# Patient Record
Sex: Female | Born: 1960 | Race: Black or African American | Hispanic: No | Marital: Single | Smoking: Never smoker
Health system: Southern US, Community
[De-identification: ages and names within clinical notes are randomized; demographics above are authoritative.]

## PROBLEM LIST (undated history)

## (undated) DIAGNOSIS — E785 Hyperlipidemia, unspecified: Secondary | ICD-10-CM

## (undated) DIAGNOSIS — E119 Type 2 diabetes mellitus without complications: Secondary | ICD-10-CM

## (undated) DIAGNOSIS — I10 Essential (primary) hypertension: Secondary | ICD-10-CM

## (undated) HISTORY — PX: ABDOMINAL HYSTERECTOMY: SHX81

## (undated) HISTORY — DX: Hyperlipidemia, unspecified: E78.5

## (undated) HISTORY — DX: Type 2 diabetes mellitus without complications: E11.9

## (undated) HISTORY — PX: VAGINAL PROLAPSE REPAIR: SHX830

## (undated) HISTORY — DX: Essential (primary) hypertension: I10

---

## 2014-02-10 ENCOUNTER — Other Ambulatory Visit (HOSPITAL_COMMUNITY): Payer: Self-pay | Admitting: Family Medicine

## 2014-02-10 DIAGNOSIS — R229 Localized swelling, mass and lump, unspecified: Principal | ICD-10-CM

## 2014-02-10 DIAGNOSIS — IMO0002 Reserved for concepts with insufficient information to code with codable children: Secondary | ICD-10-CM

## 2014-02-24 ENCOUNTER — Encounter (HOSPITAL_COMMUNITY): Payer: Self-pay

## 2014-03-17 ENCOUNTER — Ambulatory Visit (HOSPITAL_COMMUNITY)
Admission: RE | Admit: 2014-03-17 | Discharge: 2014-03-17 | Disposition: A | Discharge status: Home / Self Care | Payer: Self-pay | Source: Ambulatory Visit | Attending: Family Medicine | Admitting: Family Medicine

## 2014-03-17 ENCOUNTER — Other Ambulatory Visit (HOSPITAL_COMMUNITY): Payer: Self-pay | Admitting: Family Medicine

## 2014-03-17 DIAGNOSIS — R229 Localized swelling, mass and lump, unspecified: Secondary | ICD-10-CM

## 2014-03-17 DIAGNOSIS — IMO0002 Reserved for concepts with insufficient information to code with codable children: Secondary | ICD-10-CM

## 2014-03-17 DIAGNOSIS — R928 Other abnormal and inconclusive findings on diagnostic imaging of breast: Secondary | ICD-10-CM | POA: Insufficient documentation

## 2018-01-14 LAB — LIPID PANEL
Cholesterol: 355 — AB (ref 0–200)
HDL: 55 (ref 35–70)
LDL Cholesterol: 264
Triglycerides: 187 — AB (ref 40–160)

## 2018-01-14 LAB — HEMOGLOBIN A1C: HEMOGLOBIN A1C: 7.4

## 2018-03-24 ENCOUNTER — Ambulatory Visit (INDEPENDENT_AMBULATORY_CARE_PROVIDER_SITE_OTHER): Payer: Self-pay | Admitting: "Endocrinology

## 2018-03-24 ENCOUNTER — Encounter: Payer: Self-pay | Admitting: "Endocrinology

## 2018-03-24 VITALS — BP 148/90 | HR 76 | Ht 61.0 in | Wt 173.0 lb

## 2018-03-24 DIAGNOSIS — N183 Chronic kidney disease, stage 3 (moderate): Secondary | ICD-10-CM

## 2018-03-24 DIAGNOSIS — E1122 Type 2 diabetes mellitus with diabetic chronic kidney disease: Secondary | ICD-10-CM | POA: Insufficient documentation

## 2018-03-24 DIAGNOSIS — I10 Essential (primary) hypertension: Secondary | ICD-10-CM

## 2018-03-24 DIAGNOSIS — Z6832 Body mass index (BMI) 32.0-32.9, adult: Secondary | ICD-10-CM

## 2018-03-24 DIAGNOSIS — E6609 Other obesity due to excess calories: Secondary | ICD-10-CM

## 2018-03-24 DIAGNOSIS — Z794 Long term (current) use of insulin: Secondary | ICD-10-CM | POA: Insufficient documentation

## 2018-03-24 DIAGNOSIS — E782 Mixed hyperlipidemia: Secondary | ICD-10-CM

## 2018-03-24 NOTE — Patient Instructions (Signed)

## 2018-03-24 NOTE — Progress Notes (Signed)
Endocrinology Consult Note       03/24/2018, 1:29 PM   Subjective:    Patient ID: Ana Wallace, female    DOB: 1961-10-13.  Ana Wallace is being seen in consultation for management of currently uncontrolled symptomatic diabetes requested by  [facility]   Past Medical History:  Diagnosis Date  . Diabetes mellitus, type II (HCC)   . Hyperlipidemia   . Hypertension    Past Surgical History:  Procedure Laterality Date  . ABDOMINAL HYSTERECTOMY    . VAGINAL PROLAPSE REPAIR     Social History   Socioeconomic History  . Marital status: Single    Spouse name: Not on file  . Number of children: Not on file  . Years of education: Not on file  . Highest education level: Not on file  Occupational History  . Not on file  Social Needs  . Financial resource strain: Not on file  . Food insecurity:    Worry: Not on file    Inability: Not on file  . Transportation needs:    Medical: Not on file    Non-medical: Not on file  Tobacco Use  . Smoking status: Never Smoker  . Smokeless tobacco: Never Used  Substance and Sexual Activity  . Alcohol use: Never    Frequency: Never  . Drug use: Never  . Sexual activity: Never  Lifestyle  . Physical activity:    Days per week: Not on file    Minutes per session: Not on file  . Stress: Not on file  Relationships  . Social connections:    Talks on phone: Not on file    Gets together: Not on file    Attends religious service: Not on file    Active member of club or organization: Not on file    Attends meetings of clubs or organizations: Not on file    Relationship status: Not on file  Other Topics Concern  . Not on file  Social History Narrative  . Not on file   Outpatient Encounter Medications as of 03/24/2018  Medication Sig  . Azilsartan Medoxomil 40 MG TABS Take by mouth daily.  . busPIRone (BUSPAR) 5 MG tablet Take  5 mg by mouth 2 (two) times daily.  . DULoxetine (CYMBALTA) 60 MG capsule Take 60 mg by mouth daily.  . Insulin Glargine (LANTUS) 100 UNIT/ML Solostar Pen Inject 30 Units into the skin at bedtime.  . metFORMIN (GLUCOPHAGE) 1000 MG tablet Take 500 mg by mouth 2 (two) times daily with a meal.  . pregabalin (LYRICA) 150 MG capsule Take 150 mg by mouth 2 (two) times daily.  . Vitamin D, Ergocalciferol, (DRISDOL) 50000 units CAPS capsule Take 50,000 Units by mouth every 7 (seven) days.   No facility-administered encounter medications on file as of 03/24/2018.     ALLERGIES: Allergies  Allergen Reactions  . Nickel Other (See Comments)    Rash with burning sensation    VACCINATION STATUS:  There is no immunization history on file for this patient.  Diabetes  She presents for her initial diabetic visit. She has type 2 diabetes mellitus.  Onset time: She was diagnosed at approximate age of 57 years. There are no hypoglycemic associated symptoms. Pertinent negatives for hypoglycemia include no confusion, headaches, pallor or seizures. There are no diabetic associated symptoms. Pertinent negatives for diabetes include no chest pain, no polydipsia, no polyphagia and no polyuria. There are no hypoglycemic complications. Symptoms are stable. Diabetic complications include nephropathy. Current diabetic treatment includes insulin injections and oral agent (monotherapy). Her weight is increasing steadily. She is following a generally unhealthy diet. When asked about meal planning, she reported none. She has had a previous visit with a dietitian. She never participates in exercise. (He did not bring any meter no logs to review today.  Her A1c was found to be 7.4% on January 14, 2018.) An ACE inhibitor/angiotensin II receptor blocker is being taken.  Hyperlipidemia  This is a chronic problem. The current episode started more than 1 year ago. The problem is uncontrolled. Pertinent negatives include no chest pain,  myalgias or shortness of breath. She is currently on no antihyperlipidemic treatment (She reports side effects from statin medications.). The current treatment provides no improvement of lipids. Risk factors for coronary artery disease include dyslipidemia, diabetes mellitus, a sedentary lifestyle, post-menopausal and obesity.      Review of Systems  Constitutional: Negative for chills, fever and unexpected weight change.  HENT: Negative for trouble swallowing and voice change.   Eyes: Negative for visual disturbance.  Respiratory: Negative for cough, shortness of breath and wheezing.   Cardiovascular: Negative for chest pain, palpitations and leg swelling.  Gastrointestinal: Negative for diarrhea, nausea and vomiting.  Endocrine: Negative for cold intolerance, heat intolerance, polydipsia, polyphagia and polyuria.  Musculoskeletal: Negative for arthralgias and myalgias.  Skin: Negative for color change, pallor, rash and wound.  Neurological: Negative for seizures and headaches.  Psychiatric/Behavioral: Negative for confusion and suicidal ideas.    Objective:    BP (!) 148/90   Pulse 76   Ht 5\' 1"  (1.549 m)   Wt 173 lb (78.5 kg)   BMI 32.69 kg/m   Wt Readings from Last 3 Encounters:  03/24/18 173 lb (78.5 kg)     Physical Exam  Constitutional: She is oriented to person, place, and time. She appears well-developed.  HENT:  Head: Normocephalic and atraumatic.  Eyes: EOM are normal.  Neck: Normal range of motion. Neck supple. No tracheal deviation present. No thyromegaly present.  Cardiovascular: Normal rate and regular rhythm.  Pulmonary/Chest: Effort normal and breath sounds normal.  Abdominal: Soft. Bowel sounds are normal. There is no tenderness. There is no guarding.  Musculoskeletal: Normal range of motion. She exhibits no edema.  Neurological: She is alert and oriented to person, place, and time. She has normal reflexes. No cranial nerve deficit. Coordination normal.   Skin: Skin is warm and dry. No rash noted. No erythema. No pallor.  Psychiatric: She has a normal mood and affect. Judgment normal.   Recent Results (from the past 2160 hour(s))  Lipid panel     Status: Abnormal   Collection Time: 01/14/18 12:00 AM  Result Value Ref Range   Triglycerides 187 (A) 40 - 160   Cholesterol 355 (A) 0 - 200   HDL 55 35 - 70   LDL Cholesterol 264   Hemoglobin A1c     Status: None   Collection Time: 01/14/18 12:00 AM  Result Value Ref Range   Hemoglobin A1C 7.4     Assessment & Plan:   1. Type 2 diabetes mellitus with stage 3 chronic kidney  disease, with long-term current use of insulin (HCC)  - Ana Wallace has currently uncontrolled symptomatic type 2 DM since 57 years of age,  with most recent A1c of 7.4 %. Recent labs reviewed.  -her diabetes is complicated by stage 3 renal insufficiency and Ana Wallace remains at a high risk for more acute and chronic complications which include CAD, CVA, CKD, retinopathy, and neuropathy. These are all discussed in detail with the patient.  - I have counseled her on diet management and weight loss, by adopting a carbohydrate restricted/protein rich diet.  - Suggestion is made for her to avoid simple carbohydrates  from her diet including Cakes, Sweet Desserts, Ice Cream, Soda (diet and regular), Sweet Tea, Candies, Chips, Cookies, Store Bought Juices, Alcohol in Excess of  1-2 drinks a day, Artificial Sweeteners, and "Sugar-free" Products. This will help patient to have stable blood glucose profile and potentially avoid unintended weight gain.  - I encouraged her to switch to  unprocessed or minimally processed complex starch and increased protein intake (animal or plant source), fruits, and vegetables.  - she is advised to stick to a routine mealtimes to eat 3 meals  a day and avoid unnecessary snacks ( to snack only to correct hypoglycemia).   - she will be scheduled with Norm SaltPenny Crumpton, RDN, CDE for  individualized diabetes education.  - I have approached her with the following individualized plan to manage diabetes and patient agrees:   - I approached her to initiate strict monitoring of blood glucose 4 times a day-daily before breakfast, lunch, supper and at bedtime and return in 1 week with her meter and logs for reevaluation.    -In the meantime, I have advised her to continue Lantus 30 units nightly. -I advised her to lower her metformin to 500 mg p.o. twice daily given her renal insufficiency.    -Patient is not a candidate for SGLT2 inhibitors due to CKD.  - she will be considered for incretin therapy as appropriate next visit. - Patient specific target  A1c;  LDL, HDL, Triglycerides, and  Waist Circumference were discussed in detail.  2) BP/HTN: Her blood pressure is uncontrolled.  She is on azilsartan.  She is advised to be consistent with her blood pressure medications and salt restrictions.  3) Lipids/HPL: She has severe dyslipidemia with LDL 200+.  She reports statin intolerance, advised to avoid better and fried food.  She is a candidate for Repatha, however she does not have insurance currently.  4)  Weight/Diet: CDE Consult will be initiated , exercise, and detailed carbohydrates information provided.  5) Chronic Care/Health Maintenance:  -she  is on ARB and Statin medications and  is encouraged to continue to follow up with Ophthalmology, Dentist,  Podiatrist at least yearly or according to recommendations, and advised to  stay away from smoking. I have recommended yearly flu vaccine and pneumonia vaccination at least every 5 years; moderate intensity exercise for up to 150 minutes weekly; and  sleep for at least 7 hours a day.  - I advised patient to [facility] for primary care needs.  - Time spent with the patient: 45 minutes, of which >50% was spent in obtaining information about her symptoms, reviewing her  previous labs, evaluations, and treatments, counseling her about her currently uncontrolled type 2 diabetes, hyperlipidemia, hypertension, and developing a plan to confirm the diagnosis and long term treatment as necessary.  Ana Wallace participated in the discussions, expressed understanding,  and voiced agreement with the above plans.  All questions were answered to her satisfaction. she is encouraged to contact clinic should she have any questions or concerns prior to her return visit.  Follow up plan: - Return in about 1 week (around 03/31/2018) for follow up with meter and logs- no labs.  Marquis Lunch, MD [facility] [address] [place], [place] [address] Phone: [phone]  Fax: [phone]    03/24/2018, 1:29 PM  This note was partially dictated with voice recognition software. Similar sounding words can be transcribed inadequately or may not  be corrected upon review.

## 2018-04-04 ENCOUNTER — Ambulatory Visit: Payer: Self-pay | Admitting: "Endocrinology

## 2018-05-02 ENCOUNTER — Encounter: Payer: Self-pay | Admitting: "Endocrinology

## 2018-05-02 ENCOUNTER — Ambulatory Visit (INDEPENDENT_AMBULATORY_CARE_PROVIDER_SITE_OTHER): Payer: Medicaid Other | Admitting: "Endocrinology

## 2018-05-02 VITALS — BP 137/85 | Ht 61.0 in | Wt 169.4 lb

## 2018-05-02 DIAGNOSIS — Z794 Long term (current) use of insulin: Secondary | ICD-10-CM

## 2018-05-02 DIAGNOSIS — I10 Essential (primary) hypertension: Secondary | ICD-10-CM

## 2018-05-02 DIAGNOSIS — E1122 Type 2 diabetes mellitus with diabetic chronic kidney disease: Secondary | ICD-10-CM

## 2018-05-02 DIAGNOSIS — E782 Mixed hyperlipidemia: Secondary | ICD-10-CM

## 2018-05-02 DIAGNOSIS — N183 Chronic kidney disease, stage 3 (moderate): Secondary | ICD-10-CM

## 2018-05-02 NOTE — Progress Notes (Signed)
Endocrinology Consult Note       05/02/2018, 3:13 PM   Subjective:    Patient ID: Ana Wallace, female    DOB: 1961-04-22.  Ana Wallace is being seen in consultation for management of currently uncontrolled symptomatic diabetes requested by  [facility]   Past Medical History:  Diagnosis Date  . Diabetes mellitus, type II (HCC)   . Hyperlipidemia   . Hypertension    Past Surgical History:  Procedure Laterality Date  . ABDOMINAL HYSTERECTOMY    . VAGINAL PROLAPSE REPAIR     Social History   Socioeconomic History  . Marital status: Single    Spouse name: Not on file  . Number of children: Not on file  . Years of education: Not on file  . Highest education level: Not on file  Occupational History  . Not on file  Social Needs  . Financial resource strain: Not on file  . Food insecurity:    Worry: Not on file    Inability: Not on file  . Transportation needs:    Medical: Not on file    Non-medical: Not on file  Tobacco Use  . Smoking status: Never Smoker  . Smokeless tobacco: Never Used  Substance and Sexual Activity  . Alcohol use: Never    Frequency: Never  . Drug use: Never  . Sexual activity: Never  Lifestyle  . Physical activity:    Days per week: Not on file    Minutes per session: Not on file  . Stress: Not on file  Relationships  . Social connections:    Talks on phone: Not on file    Gets together: Not on file    Attends religious service: Not on file    Active member of club or organization: Not on file    Attends meetings of clubs or organizations: Not on file    Relationship status: Not on file  Other Topics Concern  . Not on file  Social History Narrative  . Not on file   Outpatient Encounter Medications as of 05/02/2018  Medication Sig  . Azilsartan Medoxomil 40 MG TABS Take by mouth daily.  . busPIRone (BUSPAR) 5 MG tablet Take 5  mg by mouth 2 (two) times daily.  . DULoxetine (CYMBALTA) 60 MG capsule Take 60 mg by mouth daily.  . Insulin Glargine (LANTUS) 100 UNIT/ML Solostar Pen Inject 30 Units into the skin at bedtime.  . metFORMIN (GLUCOPHAGE) 1000 MG tablet Take 500 mg by mouth 2 (two) times daily with a meal.  . pregabalin (LYRICA) 150 MG capsule Take 150 mg by mouth 2 (two) times daily.  . Vitamin D, Ergocalciferol, (DRISDOL) 50000 units CAPS capsule Take 50,000 Units by mouth every 7 (seven) days.   No facility-administered encounter medications on file as of 05/02/2018.     ALLERGIES: Allergies  Allergen Reactions  . Nickel Other (See Comments)    Rash with burning sensation    VACCINATION STATUS:  There is no immunization history on file for this patient.  Diabetes  She presents for her follow-up diabetic visit. She has type 2 diabetes mellitus.  Onset time: She was diagnosed at approximate age of 57 years. Her disease course has been improving. There are no hypoglycemic associated symptoms. Pertinent negatives for hypoglycemia include no confusion, headaches, pallor or seizures. There are no diabetic associated symptoms. Pertinent negatives for diabetes include no chest pain, no polydipsia, no polyphagia and no polyuria. There are no hypoglycemic complications. Symptoms are stable. Diabetic complications include nephropathy. Current diabetic treatment includes insulin injections and oral agent (monotherapy). Her weight is decreasing steadily. She is following a generally unhealthy diet. When asked about meal planning, she reported none. She has had a previous visit with a dietitian. She never participates in exercise. Her breakfast blood glucose range is generally 140-180 mg/dl. Her lunch blood glucose range is generally 140-180 mg/dl. Her dinner blood glucose range is generally 140-180 mg/dl. Her bedtime blood glucose range is generally 140-180 mg/dl. Her overall blood glucose range is 140-180 mg/dl. (She did  not  bring any meter no logs to review today.  She reports that her recent a1c was 7.2%.) An ACE inhibitor/angiotensin II receptor blocker is being taken.  Hyperlipidemia  This is a chronic problem. The current episode started more than 1 year ago. The problem is uncontrolled. Exacerbating diseases include chronic renal disease and diabetes. Pertinent negatives include no chest pain, myalgias or shortness of breath. She is currently on no antihyperlipidemic treatment (She reports side effects from statin medications.). The current treatment provides no improvement of lipids. Risk factors for coronary artery disease include dyslipidemia, diabetes mellitus, a sedentary lifestyle, post-menopausal and obesity.  Hypertension  This is a chronic problem. The current episode started more than 1 year ago. The problem is controlled. Pertinent negatives include no chest pain, headaches, palpitations or shortness of breath. Risk factors for coronary artery disease include dyslipidemia and diabetes mellitus. Past treatments include angiotensin blockers. Identifiable causes of hypertension include chronic renal disease.    Review of Systems  Constitutional: Negative for chills, fever and unexpected weight change.  HENT: Negative for trouble swallowing and voice change.   Eyes: Negative for visual disturbance.  Respiratory: Negative for cough, shortness of breath and wheezing.   Cardiovascular: Negative for chest pain, palpitations and leg swelling.  Gastrointestinal: Negative for diarrhea, nausea and vomiting.  Endocrine: Negative for cold intolerance, heat intolerance, polydipsia, polyphagia and polyuria.  Musculoskeletal: Negative for arthralgias and myalgias.  Skin: Negative for color change, pallor, rash and wound.  Neurological: Negative for seizures and headaches.  Psychiatric/Behavioral: Negative for confusion and suicidal ideas.    Objective:    BP 137/85   Ht  (1.549 m)   Wt 169 lb 6.4 oz  (76.8 kg)   BMI 32.01 kg/m   Wt Readings from Last 3 Encounters:  05/02/18 169 lb 6.4 oz (76.8 kg)  03/24/18 173 lb (78.5 kg)     Physical Exam  Constitutional: She is oriented to person, place, and time. She appears well-developed.  HENT:  Head: Normocephalic and atraumatic.  Eyes: EOM are normal.  Neck: Normal range of motion. Neck supple. No tracheal deviation present. No thyromegaly present.  Cardiovascular: Normal rate.  Pulmonary/Chest: Effort normal.  Abdominal: There is no tenderness. There is no guarding.  Musculoskeletal: Normal range of motion. She exhibits no edema.  Neurological: She is alert and oriented to person, place, and time. She has normal reflexes. No cranial nerve deficit. Coordination normal.  Skin: Skin is warm and dry. No rash noted. No erythema. No pallor.  Psychiatric: She has a normal mood and affect. Judgment normal.  Assessment & Plan:   1. Type 2 diabetes mellitus with stage 3 chronic kidney disease, with long-term current use of insulin (HCC)  - Ana Wallace has currently uncontrolled symptomatic type 2 DM since 57 years of age. She reports her pre-visit labs show a1c of 7.2%. Her labs are not available to review. -her diabetes is complicated by stage 3 renal insufficiency and Ana Wallace remains at a high risk for more acute and chronic complications which include CAD, CVA, CKD, retinopathy, and neuropathy. These are all discussed in detail with the patient.  - I have counseled her on diet management and weight loss, by adopting a carbohydrate restricted/protein rich diet.  -  Suggestion is made for her to avoid simple carbohydrates  from her diet including Cakes, Sweet Desserts / Pastries, Ice Cream, Soda (diet and regular), Sweet Tea, Candies, Chips, Cookies, Store Bought Juices, Alcohol in Excess of  1-2 drinks a day, Artificial Sweeteners, and "Sugar-free" Products. This will help patient to have stable blood glucose profile and  potentially avoid unintended weight gain.   - I encouraged her to switch to  unprocessed or minimally processed complex starch and increased protein intake (animal or plant source), fruits, and vegetables.  - she is advised to stick to a routine mealtimes to eat 3 meals  a day and avoid unnecessary snacks ( to snack only to correct hypoglycemia).    - I have approached her with the following individualized plan to manage diabetes and patient agrees:   - Based on her reported improvement in her a1c and her presentation with controlled BG profile, she will not require bolus insulin for now.  - I advised her to continue Lantus 30 units qhs, associated with strict monitoring of blood glucose 2 times a day-daily before breakfast,  and at bedtime.   -I advised her to continue  metformin  500 mg p.o. twice daily given her renal insufficiency.    -Patient is not a candidate for SGLT2 inhibitors due to CKD.  - she will be considered for incretin therapy as appropriate next visit. - Patient specific target  A1c;  LDL, HDL, Triglycerides, and  Waist Circumference were discussed in detail.  2) BP/HTN: Her blood pressure is controlled.  She is on azilsartan.  She is advised to be consistent with her blood pressure medications and salt restrictions.  3) Lipids/HPL: She has severe dyslipidemia with LDL 200+.  She reports statin intolerance, advised to avoid better and fried food.  She is a candidate for Repatha, however she does not have insurance currently.  4)  Weight/Diet: CDE Consult has been  initiated , exercise, and detailed carbohydrates information provided.  5) Chronic Care/Health Maintenance:  -she  is on ARB and Statin medications and  is encouraged to continue to follow up with Ophthalmology, Dentist,  Podiatrist at least yearly or according to recommendations, and advised to  stay away from smoking. I have recommended yearly flu vaccine and pneumonia vaccination at least every 5 years;  moderate intensity exercise for up to 150 minutes weekly; and  sleep for at least 7 hours a day.  - I advised patient to maintain close follow up with [facility] for primary care needs.  - Time spent with the patient: 25 min, of which >50% was spent in reviewing her blood glucose logs , discussing her hypo- and hyper-glycemic episodes, reviewing her current and  previous labs and insulin doses and developing a plan to avoid hypo- and hyper-glycemia. Please  refer to Patient Instructions for Blood Glucose Monitoring and Insulin/Medications Dosing Guide"  in media tab for additional information. Ana Wallace participated in the discussions, expressed understanding, and voiced agreement with the above plans.  All questions were answered to her satisfaction. she is encouraged to contact clinic should she have any questions or concerns prior to her return visit.  Follow up plan: - Return in about 3 months (around 08/02/2018) for follow up with pre-visit labs, meter, and logs.  Marquis Lunch, MD [facility] [address] [place], [place] [address] Phone: [phone]  Fax: [phone]    05/02/2018, 3:13 PM  This note was partially dictated with voice recognition software. Similar sounding words can be transcribed inadequately or may not  be corrected upon review.

## 2018-05-02 NOTE — Patient Instructions (Signed)

## 2018-05-29 ENCOUNTER — Other Ambulatory Visit (HOSPITAL_COMMUNITY): Payer: Self-pay | Admitting: Nephrology

## 2018-05-29 DIAGNOSIS — N183 Chronic kidney disease, stage 3 unspecified: Secondary | ICD-10-CM

## 2018-06-17 ENCOUNTER — Telehealth: Payer: Self-pay | Admitting: Nutrition

## 2018-06-17 ENCOUNTER — Other Ambulatory Visit: Payer: Self-pay | Admitting: "Endocrinology

## 2018-06-17 DIAGNOSIS — N183 Chronic kidney disease, stage 3 unspecified: Secondary | ICD-10-CM

## 2018-06-17 DIAGNOSIS — Z794 Long term (current) use of insulin: Principal | ICD-10-CM

## 2018-06-17 DIAGNOSIS — E1122 Type 2 diabetes mellitus with diabetic chronic kidney disease: Secondary | ICD-10-CM

## 2018-06-17 NOTE — Telephone Encounter (Signed)
[facility] to [facility] to [facility] labs from [facility] regarding pt's elevated Uric Acid level of 9.2. Nurse or provider to return call.

## 2018-06-18 ENCOUNTER — Ambulatory Visit: Payer: Medicaid Other | Admitting: Nutrition

## 2018-06-19 ENCOUNTER — Ambulatory Visit (HOSPITAL_COMMUNITY)
Admission: RE | Admit: 2018-06-19 | Discharge: 2018-06-19 | Disposition: A | Discharge status: Home / Self Care | Payer: Self-pay | Source: Ambulatory Visit | Attending: Nephrology | Admitting: Nephrology

## 2018-06-19 DIAGNOSIS — N183 Chronic kidney disease, stage 3 unspecified: Secondary | ICD-10-CM

## 2018-06-26 ENCOUNTER — Telehealth: Payer: Self-pay | Admitting: "Endocrinology

## 2018-06-26 NOTE — Telephone Encounter (Signed)
Patient is aware of the recommendations 

## 2018-06-26 NOTE — Telephone Encounter (Signed)
Ana Wallace is calling stating that she cant remember if Dr. Fransico HimNida told her to quit taking metFORMIN (GLUCOPHAGE) 1000 MG tablet or to continue it , please advise?

## 2018-06-26 NOTE — Telephone Encounter (Signed)
She is advised to stop it  Until repeat labs, just continue on insulin and keep follow up appointment.

## 2018-07-29 ENCOUNTER — Encounter: Payer: Self-pay | Admitting: "Endocrinology

## 2018-08-08 ENCOUNTER — Ambulatory Visit: Payer: Medicaid Other | Admitting: "Endocrinology

## 2018-08-19 LAB — HEMOGLOBIN A1C
EAG (MMOL/L): 13.6 (calc)
HEMOGLOBIN A1C: 10.2 %{Hb} — AB (ref <5.7)
Mean Plasma Glucose: 246 (calc)

## 2018-08-19 LAB — COMPLETE METABOLIC PANEL WITH GFR
AG RATIO: 1.5 (calc) (ref 1.0–2.5)
ALKALINE PHOSPHATASE (APISO): 53 U/L (ref 33–130)
ALT: 11 U/L (ref 6–29)
AST: 12 U/L (ref 10–35)
Albumin: 4.1 g/dL (ref 3.6–5.1)
BUN/Creatinine Ratio: 19 (calc) (ref 6–22)
BUN: 27 mg/dL — ABNORMAL HIGH (ref 7–25)
CO2: 29 mmol/L (ref 20–32)
Calcium: 9.6 mg/dL (ref 8.6–10.4)
Chloride: 105 mmol/L (ref 98–110)
Creat: 1.44 mg/dL — ABNORMAL HIGH (ref 0.50–1.05)
GFR, EST NON AFRICAN AMERICAN: 41 mL/min/{1.73_m2} — AB (ref >=60)
GFR, Est African American: 47 mL/min/{1.73_m2} — ABNORMAL LOW (ref >=60)
Globulin: 2.7 g/dL (calc) (ref 1.9–3.7)
Glucose, Bld: 203 mg/dL — ABNORMAL HIGH (ref 65–99)
POTASSIUM: 4.7 mmol/L (ref 3.5–5.3)
SODIUM: 141 mmol/L (ref 135–146)
Total Bilirubin: 0.6 mg/dL (ref 0.2–1.2)
Total Protein: 6.8 g/dL (ref 6.1–8.1)

## 2018-08-28 ENCOUNTER — Encounter: Payer: Medicaid Other | Attending: Emergency Medicine | Admitting: Nutrition

## 2018-08-28 ENCOUNTER — Encounter: Payer: Self-pay | Admitting: "Endocrinology

## 2018-08-28 ENCOUNTER — Encounter: Payer: Self-pay | Admitting: Nutrition

## 2018-08-28 ENCOUNTER — Ambulatory Visit (INDEPENDENT_AMBULATORY_CARE_PROVIDER_SITE_OTHER): Payer: Self-pay | Admitting: "Endocrinology

## 2018-08-28 VITALS — BP 133/76 | HR 85 | Ht 61.0 in | Wt 169.0 lb

## 2018-08-28 DIAGNOSIS — Z794 Long term (current) use of insulin: Secondary | ICD-10-CM

## 2018-08-28 DIAGNOSIS — N183 Chronic kidney disease, stage 3 unspecified: Secondary | ICD-10-CM

## 2018-08-28 DIAGNOSIS — E1122 Type 2 diabetes mellitus with diabetic chronic kidney disease: Secondary | ICD-10-CM

## 2018-08-28 DIAGNOSIS — E6609 Other obesity due to excess calories: Secondary | ICD-10-CM

## 2018-08-28 DIAGNOSIS — IMO0002 Reserved for concepts with insufficient information to code with codable children: Secondary | ICD-10-CM

## 2018-08-28 DIAGNOSIS — E782 Mixed hyperlipidemia: Secondary | ICD-10-CM

## 2018-08-28 DIAGNOSIS — E1165 Type 2 diabetes mellitus with hyperglycemia: Secondary | ICD-10-CM

## 2018-08-28 DIAGNOSIS — Z6832 Body mass index (BMI) 32.0-32.9, adult: Secondary | ICD-10-CM

## 2018-08-28 DIAGNOSIS — I10 Essential (primary) hypertension: Secondary | ICD-10-CM

## 2018-08-28 DIAGNOSIS — E118 Type 2 diabetes mellitus with unspecified complications: Principal | ICD-10-CM

## 2018-08-28 MED ORDER — INSULIN GLARGINE 100 UNIT/ML SOLOSTAR PEN: 50.0000 [IU] | PEN_INJECTOR | Freq: Every day | SUBCUTANEOUS | 2 refills | Status: DC

## 2018-08-28 MED ORDER — INSULIN PEN NEEDLE 31G X 8 MM MISC: 1.0000 | 3 refills | Status: DC

## 2018-08-28 MED ORDER — NIACIN ER (ANTIHYPERLIPIDEMIC) 500 MG PO TBCR: 500.0000 mg | EXTENDED_RELEASE_TABLET | Freq: Every day | ORAL | 3 refills | Status: DC

## 2018-08-28 NOTE — Progress Notes (Signed)
  Medical Nutrition Therapy:  Appt start time: 0930 end time:  0945   Assessment:  Primary concerns today: Diabetes Type 2, overweight, Stg 3 CKD Walkin from Dr.Nida, Endocrinology. Barriers are fiancial and transportation. Works part time at [employer]. Eats 2-3 meals per day. Trying to avoid foods high in potassium and sodium Willing to work on changes with diet to improve her BS and kidney issues.  Lantus 50 units a day.  Lab Results  Component Value Date   HGBA1C 10.2 (H) 08/18/2018   CMP Latest Ref Rng & Units 08/18/2018  Glucose 65 - 99 mg/dL 161(W203(H)  BUN 7 - 25 mg/dL 96(E27(H)  Creatinine 4.540.50 - 1.05 mg/dL 0.98(J1.44(H)  Sodium 191135 - 478146 mmol/L 141  Potassium 3.5 - 5.3 mmol/L 4.7  Chloride 98 - 110 mmol/L 105  CO2 20 - 32 mmol/L 29  Calcium 8.6 - 10.4 mg/dL 9.6  Total Protein 6.1 - 8.1 g/dL 6.8  Total Bilirubin 0.2 - 1.2 mg/dL 0.6  AST 10 - 35 U/L 12  ALT 6 - 29 U/L 11   Lipid Panel     Component Value Date/Time   CHOL 355 (A) 01/14/2018   TRIG 187 (A) 01/14/2018   HDL 55 01/14/2018   LDLCALC 264 01/14/2018     Preferred Learning Style:   Auditory  Visual  Hands on  Learning Readiness:   Ready  Change in progress   MEDICATIONS: see list   DIETARY INTAKE:  Eats 2 meals per day. Usually skips lunch and eats dinner later.  Usual physical activity: ADL  Estimated energy needs: 1200 calories 135 g carbohydrates 90 g protein 33 g fat  Progress Towards Goal(s):  In progress.   Nutritional Diagnosis:  NB-1.1 Food and nutrition-related knowledge deficit As related to Diabets.  As evidenced by A1C 10.3%.    Intervention: Nutrition and Diabetes education provided on My Plate, CHO counting, meal planning, portion sizes, timing of meals, avoiding snacks between meals unless having a low blood sugar, target ranges for A1C and blood sugars, signs/symptoms and treatment of hyper/hypoglycemia, monitoring blood sugars, taking medications as prescribed, benefits of exercising  30 minutes per day and prevention of complications of DM. Marland Kitchen. Goals Follow MY Plate Follow Renal Diet Take insulin as prescribed Drink only water  Cut out snacks Eat three meals per day. Get A1C down to 7%   Teaching Method Utilized:  Visual Auditory Hands on  Handouts given during visit include:  The Plate Method   Renal Diet  Barriers to learning/adherence to lifestyle change: none  Demonstrated degree of understanding via:  Teach Back   Monitoring/Evaluation:  Dietary intake, exercise, nd body weight in 2 week(s).

## 2018-08-28 NOTE — Patient Instructions (Signed)

## 2018-08-28 NOTE — Patient Instructions (Signed)
Goals Follow MY Plate Follow Renal Diet Take insulin as prescribed Drink only water  Cut out snacks Eat three meals per day. Get A1C down to 7%

## 2018-08-28 NOTE — Progress Notes (Signed)
Endocrinology follow-up note       08/28/2018, 2:07 PM   Subjective:    Patient ID: Ana Wallace, female    DOB: 12/11/1961.  Ana Wallace is being seen in follow up  for management of currently uncontrolled symptomatic diabetes, hyperlipidemia, hypertension [facility]  [facility]   Past Medical History:  Diagnosis Date  . Diabetes mellitus, type II (HCC)   . Hyperlipidemia   . Hypertension    Past Surgical History:  Procedure Laterality Date  . ABDOMINAL HYSTERECTOMY    . VAGINAL PROLAPSE REPAIR     Social History   Socioeconomic History  . Marital status: Single    Spouse name: Not on file  . Number of children: Not on file  . Years of education: Not on file  . Highest education level: Not on file  Occupational History  . Not on file  Social Needs  . Financial resource strain: Not on file  . Food insecurity:    Worry: Not on file    Inability: Not on file  . Transportation needs:    Medical: Not on file    Non-medical: Not on file  Tobacco Use  . Smoking status: Never Smoker  . Smokeless tobacco: Never Used  Substance and Sexual Activity  . Alcohol use: Never    Frequency: Never  . Drug use: Never  . Sexual activity: Never  Lifestyle  . Physical activity:    Days per week: Not on file    Minutes per session: Not on file  . Stress: Not on file  Relationships  . Social connections:    Talks on phone: Not on file    Gets together: Not on file    Attends religious service: Not on file    Active member of club or organization: Not on file    Attends meetings of clubs or organizations: Not on file    Relationship status: Not on file  Other Topics Concern  . Not on file  Social History Narrative  . Not on file   Outpatient Encounter Medications as of 08/28/2018  Medication Sig  . losartan (COZAAR) 25 MG tablet Take 25 mg by mouth daily.  .  Azilsartan Medoxomil 40 MG TABS Take by mouth daily.  . busPIRone (BUSPAR) 5 MG tablet Take 5 mg by mouth 2 (two) times daily.  . DULoxetine (CYMBALTA) 60 MG capsule Take 60 mg by mouth daily.  . Insulin Glargine (LANTUS SOLOSTAR) 100 UNIT/ML Solostar Pen Inject 50 Units into the skin at bedtime.  . Insulin Pen Needle (B-D ULTRAFINE III SHORT PEN) 31G X 8 MM MISC 1 each by Does not apply route as directed.  . pregabalin (LYRICA) 150 MG capsule Take 150 mg by mouth 2 (two) times daily.  . Vitamin D, Ergocalciferol, (DRISDOL) 50000 units CAPS capsule Take 50,000 Units by mouth every 7 (seven) days.  . [DISCONTINUED] Insulin Glargine (LANTUS) 100 UNIT/ML Solostar Pen Inject 50 Units into the skin at bedtime.  . [DISCONTINUED] metFORMIN (GLUCOPHAGE) 1000 MG tablet Take 500 mg by mouth 2 (two) times daily with a meal.   No facility-administered  encounter medications on file as of 08/28/2018.     ALLERGIES: Allergies  Allergen Reactions  . Nickel Other (See Comments)    Rash with burning sensation    VACCINATION STATUS:  There is no immunization history on file for this patient.  Diabetes  She presents for her follow-up diabetic visit. She has type 2 diabetes mellitus. Onset time: She was diagnosed at approximate age of 47 years. Her disease course has been worsening. There are no hypoglycemic associated symptoms. Pertinent negatives for hypoglycemia include no confusion, headaches, pallor or seizures. There are no diabetic associated symptoms. Pertinent negatives for diabetes include no chest pain, no polydipsia, no polyphagia and no polyuria. There are no hypoglycemic complications. Symptoms are worsening. Diabetic complications include nephropathy. Current diabetic treatment includes insulin injections and oral agent (monotherapy). Her weight is fluctuating minimally. She is following a generally unhealthy diet. When asked about meal planning, she reported none. She has had a previous visit with  a dietitian. She never participates in exercise. Her breakfast blood glucose range is generally >200 mg/dl. Her overall blood glucose range is >200 mg/dl. An ACE inhibitor/angiotensin II receptor blocker is being taken.  Hyperlipidemia  This is a chronic problem. The current episode started more than 1 year ago. The problem is uncontrolled. Exacerbating diseases include chronic renal disease and diabetes. Pertinent negatives include no chest pain, myalgias or shortness of breath. She is currently on no antihyperlipidemic treatment (She reports side effects from statin medications.). The current treatment provides no improvement of lipids. Risk factors for coronary artery disease include dyslipidemia, diabetes mellitus, a sedentary lifestyle, post-menopausal and obesity.  Hypertension  This is a chronic problem. The current episode started more than 1 year ago. The problem is controlled. Pertinent negatives include no chest pain, headaches, palpitations or shortness of breath. Risk factors for coronary artery disease include dyslipidemia and diabetes mellitus. Past treatments include angiotensin blockers. Identifiable causes of hypertension include chronic renal disease.    Review of Systems  Constitutional: Negative for chills, fever and unexpected weight change.  HENT: Negative for trouble swallowing and voice change.   Eyes: Negative for visual disturbance.  Respiratory: Negative for cough, shortness of breath and wheezing.   Cardiovascular: Negative for chest pain, palpitations and leg swelling.  Gastrointestinal: Negative for diarrhea, nausea and vomiting.  Endocrine: Negative for cold intolerance, heat intolerance, polydipsia, polyphagia and polyuria.  Musculoskeletal: Negative for arthralgias and myalgias.  Skin: Negative for color change, pallor, rash and wound.  Neurological: Negative for seizures and headaches.  Psychiatric/Behavioral: Negative for confusion and suicidal ideas.     Objective:    BP 133/76   Pulse 85   Ht 5\' 1"  (1.549 m)   Wt 169 lb (76.7 kg)   BMI 31.93 kg/m   Wt Readings from Last 3 Encounters:  08/28/18 169 lb (76.7 kg)  05/02/18 169 lb 6.4 oz (76.8 kg)  03/24/18 173 lb (78.5 kg)     Physical Exam  Constitutional: She is oriented to person, place, and time. She appears well-developed.  HENT:  Head: Normocephalic and atraumatic.  Eyes: EOM are normal.  Neck: Normal range of motion. Neck supple. No tracheal deviation present. No thyromegaly present.  Cardiovascular: Normal rate.  Pulmonary/Chest: Effort normal.  Abdominal: There is no tenderness. There is no guarding.  Musculoskeletal: Normal range of motion. She exhibits no edema.  Neurological: She is alert and oriented to person, place, and time. No cranial nerve deficit. Coordination normal.  Skin: Skin is warm and dry. No  rash noted. No erythema. No pallor.  Psychiatric: She has a normal mood and affect. Judgment normal.   Recent Results (from the past 2160 hour(s))  COMPLETE METABOLIC PANEL WITH GFR     Status: Abnormal   Collection Time: 08/18/18 11:51 AM  Result Value Ref Range   Glucose, Bld 203 (H) 65 - 99 mg/dL    Comment: .            Fasting reference interval . For someone without known diabetes, a glucose value >125 mg/dL indicates that they may have diabetes and this should be confirmed with a follow-up test. .    BUN 27 (H) 7 - 25 mg/dL   Creat 4.09 (H) 8.11 - 1.05 mg/dL    Comment: For patients >30 years of age, the reference limit for Creatinine is approximately 13% higher for people identified as African-American. .    GFR, Est Non African American 41 (L) > OR = 60 mL/min/1.71m2   GFR, Est African American 47 (L) > OR = 60 mL/min/1.42m2   BUN/Creatinine Ratio 19 6 - 22 (calc)   Sodium 141 135 - 146 mmol/L   Potassium 4.7 3.5 - 5.3 mmol/L   Chloride 105 98 - 110 mmol/L   CO2 29 20 - 32 mmol/L   Calcium 9.6 8.6 - 10.4 mg/dL   Total Protein 6.8 6.1  - 8.1 g/dL   Albumin 4.1 3.6 - 5.1 g/dL   Globulin 2.7 1.9 - 3.7 g/dL (calc)   AG Ratio 1.5 1.0 - 2.5 (calc)   Total Bilirubin 0.6 0.2 - 1.2 mg/dL   Alkaline phosphatase (APISO) 53 33 - 130 U/L   AST 12 10 - 35 U/L   ALT 11 6 - 29 U/L  Hemoglobin A1c     Status: Abnormal   Collection Time: 08/18/18 11:51 AM  Result Value Ref Range   Hgb A1c MFr Bld 10.2 (H) <5.7 % of total Hgb    Comment: For someone without known diabetes, a hemoglobin A1c value of 6.5% or greater indicates that they may have  diabetes and this should be confirmed with a follow-up  test. . For someone with known diabetes, a value <7% indicates  that their diabetes is well controlled and a value  greater than or equal to 7% indicates suboptimal  control. A1c targets should be individualized based on  duration of diabetes, age, comorbid conditions, and  other considerations. . Currently, no consensus exists regarding use of hemoglobin A1c for diagnosis of diabetes for children. .    Mean Plasma Glucose 246 (calc)   eAG (mmol/L) 13.6 (calc)     Assessment & Plan:   1. Type 2 diabetes mellitus with stage 3 chronic kidney disease, with long-term current use of insulin (HCC)  - Ana Wallace has currently uncontrolled symptomatic type 2 DM since 57 years of age. She returns with new set of labs showing A1c significantly higher at 10.2%.  Her labs are not available to review. -her diabetes is complicated by stage 2-3 renal insufficiency and Ana Wallace remains at a high risk for more acute and chronic complications which include CAD, CVA, CKD, retinopathy, and neuropathy. These are all discussed in detail with the patient.  - I have counseled her on diet management and weight loss, by adopting a carbohydrate restricted/protein rich diet.  -  Suggestion is made for her to avoid simple carbohydrates  from her diet including Cakes, Sweet Desserts / Pastries, Ice Cream, Soda (diet and regular), Sweet Tea,  Candies, Chips, Cookies, Store Bought Juices, Alcohol in Excess of  1-2 drinks a day, Artificial Sweeteners, and "Sugar-free" Products. This will help patient to have stable blood glucose profile and potentially avoid unintended weight gain.  - I encouraged her to switch to  unprocessed or minimally processed complex starch and increased protein intake (animal or plant source), fruits, and vegetables.  - she is advised to stick to a routine mealtimes to eat 3 meals  a day and avoid unnecessary snacks ( to snack only to correct hypoglycemia).    - I have approached her with the following individualized plan to manage diabetes and patient agrees:   - Based on her presentation with persistent hyperglycemia and A1c of 10.2% she will require higher dose of insulin treatment.    -I advised her to increase Lantus to 50 units nightly, associated with strict monitoring of blood glucose 4 times a day-daily before meals and at bedtime and return in 10 days with her meter and logs for reevaluation. Given worsening renal function, I advised her to discontinue metformin at this time.       -Patient is not a candidate for SGLT2 inhibitors due to CKD.  - she will be considered for incretin therapy as appropriate next visit. - Patient specific target  A1c;  LDL, HDL, Triglycerides, and  Waist Circumference were discussed in detail.  2) BP/HTN: Her blood pressure is controlled.  She is on azilsartan.  She is advised to be consistent with her blood pressure medications and salt restrictions.  3) Lipids/HPL: She has severe dyslipidemia with LDL 200+.  She reports statin intolerance, advised to avoid better and fried food.  She is a candidate for Repatha, however she does not have insurance currently.  She will be considered for Niaspan treatment.  4)  Weight/Diet: CDE Consult has been  initiated , exercise, and detailed carbohydrates information provided.  5) Chronic Care/Health Maintenance:  -she  is on ARB  and Statin medications and  is encouraged to continue to follow up with Ophthalmology, Dentist,  Podiatrist at least yearly or according to recommendations, and advised to  stay away from smoking. I have recommended yearly flu vaccine and pneumonia vaccination at least every 5 years; moderate intensity exercise for up to 150 minutes weekly; and  sleep for at least 7 hours a day.  - I advised patient to maintain close follow up with [facility] for primary care needs.  - Time spent with the patient: 25 min, of which >50% was spent in reviewing her blood glucose logs , discussing her hypo- and hyper-glycemic episodes, reviewing her current and  previous labs and insulin doses and developing a plan to avoid hypo- and hyper-glycemia. Please refer to Patient Instructions for Blood Glucose Monitoring and Insulin/Medications Dosing Guide"  in media tab for additional information. Ana Hose participated in the discussions, expressed understanding, and voiced agreement with the above plans.  All questions were answered to her satisfaction. she is encouraged to contact clinic should she have any questions or concerns prior to her return visit.   Follow up plan: - Return in about 10 days (around 09/07/2018) for Follow up with Meter and Logs Only - no Labs.  Marquis Lunch, MD [facility] [address] [place], [place] [address] Phone: [phone]  Fax: [phone]    08/28/2018, 2:07 PM  This note was partially dictated with voice recognition software. Similar sounding words can be transcribed inadequately or may  not  be corrected upon review.

## 2018-09-09 ENCOUNTER — Ambulatory Visit: Payer: Medicaid Other | Admitting: Nutrition

## 2018-09-09 ENCOUNTER — Ambulatory Visit: Payer: Medicaid Other | Admitting: "Endocrinology

## 2018-10-13 ENCOUNTER — Encounter: Payer: Self-pay | Admitting: "Endocrinology

## 2018-10-13 ENCOUNTER — Ambulatory Visit (INDEPENDENT_AMBULATORY_CARE_PROVIDER_SITE_OTHER): Payer: Medicaid Other | Admitting: "Endocrinology

## 2018-10-13 VITALS — BP 129/78 | HR 77 | Ht 61.0 in | Wt 173.0 lb

## 2018-10-13 DIAGNOSIS — Z6832 Body mass index (BMI) 32.0-32.9, adult: Secondary | ICD-10-CM

## 2018-10-13 DIAGNOSIS — N183 Chronic kidney disease, stage 3 (moderate): Secondary | ICD-10-CM

## 2018-10-13 DIAGNOSIS — E782 Mixed hyperlipidemia: Secondary | ICD-10-CM

## 2018-10-13 DIAGNOSIS — Z794 Long term (current) use of insulin: Secondary | ICD-10-CM

## 2018-10-13 DIAGNOSIS — E1122 Type 2 diabetes mellitus with diabetic chronic kidney disease: Secondary | ICD-10-CM

## 2018-10-13 DIAGNOSIS — E6609 Other obesity due to excess calories: Secondary | ICD-10-CM

## 2018-10-13 DIAGNOSIS — I10 Essential (primary) hypertension: Secondary | ICD-10-CM

## 2018-10-13 MED ORDER — INSULIN ASPART 100 UNIT/ML FLEXPEN: 10.0000 [IU] | PEN_INJECTOR | Freq: Three times a day (TID) | SUBCUTANEOUS | 2 refills | Status: DC

## 2018-10-13 NOTE — Progress Notes (Signed)
Endocrinology follow-up note       10/13/2018, 12:56 PM   Subjective:    Patient ID: Ana Wallace, female    DOB: August 03, 1961.  Ana Wallace is being seen in follow up  for management of currently uncontrolled symptomatic type 2 diabetes, hyperlipidemia, hypertension requested by  [facility]   Past Medical History:  Diagnosis Date  . Diabetes mellitus, type II (HCC)   . Hyperlipidemia   . Hypertension    Past Surgical History:  Procedure Laterality Date  . ABDOMINAL HYSTERECTOMY    . VAGINAL PROLAPSE REPAIR     Social History   Socioeconomic History  . Marital status: Single    Spouse name: Not on file  . Number of children: Not on file  . Years of education: Not on file  . Highest education level: Not on file  Occupational History  . Not on file  Social Needs  . Financial resource strain: Not on file  . Food insecurity:    Worry: Not on file    Inability: Not on file  . Transportation needs:    Medical: Not on file    Non-medical: Not on file  Tobacco Use  . Smoking status: Never Smoker  . Smokeless tobacco: Never Used  Substance and Sexual Activity  . Alcohol use: Never    Frequency: Never  . Drug use: Never  . Sexual activity: Never  Lifestyle  . Physical activity:    Days per week: Not on file    Minutes per session: Not on file  . Stress: Not on file  Relationships  . Social connections:    Talks on phone: Not on file    Gets together: Not on file    Attends religious service: Not on file    Active member of club or organization: Not on file    Attends meetings of clubs or organizations: Not on file    Relationship status: Not on file  Other Topics Concern  . Not on file  Social History Narrative  . Not on file   Outpatient Encounter Medications as of 10/13/2018  Medication Sig  . busPIRone (BUSPAR) 5 MG tablet Take 5 mg by mouth 2  (two) times daily.  . DULoxetine (CYMBALTA) 60 MG capsule Take 60 mg by mouth daily.  . insulin aspart (NOVOLOG FLEXPEN) 100 UNIT/ML FlexPen Inject 10-16 Units into the skin 3 (three) times daily with meals.  Marland Kitchen LANTUS 100 UNIT/ML injection 50 Units at bedtime.  Marland Kitchen losartan (COZAAR) 25 MG tablet Take 25 mg by mouth daily.  . niacin (NIASPAN) 500 MG CR tablet Take 1 tablet (500 mg total) by mouth at bedtime. (Patient not taking: Reported on 10/13/2018)  . pregabalin (LYRICA) 150 MG capsule Take 150 mg by mouth 2 (two) times daily.  . Vitamin D, Ergocalciferol, (DRISDOL) 50000 units CAPS capsule Take 50,000 Units by mouth every 7 (seven) days.  . [DISCONTINUED] Azilsartan Medoxomil 40 MG TABS Take by mouth daily.  . [DISCONTINUED] Insulin Glargine (LANTUS SOLOSTAR) 100 UNIT/ML Solostar Pen Inject 50 Units into the skin at bedtime.  . [DISCONTINUED] Insulin Pen Needle (B-D  ULTRAFINE III SHORT PEN) 31G X 8 MM MISC 1 each by Does not apply route as directed.   No facility-administered encounter medications on file as of 10/13/2018.     ALLERGIES: Allergies  Allergen Reactions  . Nickel Other (See Comments)    Rash with burning sensation    VACCINATION STATUS:  There is no immunization history on file for this patient.  Diabetes  She presents for her follow-up diabetic visit. She has type 2 diabetes mellitus. Onset time: She was diagnosed at approximate age of 47 years. Her disease course has been worsening. There are no hypoglycemic associated symptoms. Pertinent negatives for hypoglycemia include no confusion, headaches, pallor or seizures. Associated symptoms include polydipsia and polyuria. Pertinent negatives for diabetes include no chest pain and no polyphagia. There are no hypoglycemic complications. Symptoms are worsening. Diabetic complications include nephropathy. Current diabetic treatment includes insulin injections and oral agent (monotherapy). Her weight is increasing steadily. She  is following a generally unhealthy diet. When asked about meal planning, she reported none. She has had a previous visit with a dietitian. She never participates in exercise. Her breakfast blood glucose range is generally 180-200 mg/dl. Her lunch blood glucose range is generally >200 mg/dl. Her dinner blood glucose range is generally >200 mg/dl. Her bedtime blood glucose range is generally >200 mg/dl. Her overall blood glucose range is >200 mg/dl. An ACE inhibitor/angiotensin II receptor blocker is being taken.  Hyperlipidemia  This is a chronic problem. The current episode started more than 1 year ago. The problem is uncontrolled. Exacerbating diseases include chronic renal disease and diabetes. Pertinent negatives include no chest pain, myalgias or shortness of breath. She is currently on no antihyperlipidemic treatment (She reports side effects from statin medications.). The current treatment provides no improvement of lipids. Risk factors for coronary artery disease include dyslipidemia, diabetes mellitus, a sedentary lifestyle, post-menopausal and obesity.  Hypertension  This is a chronic problem. The current episode started more than 1 year ago. The problem is controlled. Pertinent negatives include no chest pain, headaches, palpitations or shortness of breath. Risk factors for coronary artery disease include dyslipidemia and diabetes mellitus. Past treatments include angiotensin blockers. Identifiable causes of hypertension include chronic renal disease.    Review of Systems  Constitutional: Negative for chills, fever and unexpected weight change.  HENT: Negative for trouble swallowing and voice change.   Eyes: Negative for visual disturbance.  Respiratory: Negative for cough, shortness of breath and wheezing.   Cardiovascular: Negative for chest pain, palpitations and leg swelling.  Gastrointestinal: Negative for diarrhea, nausea and vomiting.  Endocrine: Positive for polydipsia and polyuria.  Negative for cold intolerance, heat intolerance and polyphagia.  Musculoskeletal: Negative for arthralgias and myalgias.  Skin: Negative for color change, pallor, rash and wound.  Neurological: Negative for seizures and headaches.  Psychiatric/Behavioral: Negative for confusion and suicidal ideas.    Objective:    BP 129/78   Pulse 77   Ht 5\' 1"  (1.549 m)   Wt 173 lb (78.5 kg)   BMI 32.69 kg/m   Wt Readings from Last 3 Encounters:  10/13/18 173 lb (78.5 kg)  08/28/18 169 lb (76.7 kg)  05/02/18 169 lb 6.4 oz (76.8 kg)     Physical Exam  Constitutional: She is oriented to person, place, and time. She appears well-developed.  HENT:  Head: Normocephalic and atraumatic.  Eyes: EOM are normal.  Neck: Normal range of motion. Neck supple. No tracheal deviation present. No thyromegaly present.  Cardiovascular: Normal rate.  Pulmonary/Chest:  Effort normal.  Abdominal: There is no tenderness. There is no guarding.  Musculoskeletal: Normal range of motion. She exhibits no edema.  Neurological: She is alert and oriented to person, place, and time. No cranial nerve deficit. Coordination normal.  Skin: Skin is warm and dry. No rash noted. No erythema. No pallor.  Psychiatric: She has a normal mood and affect. Judgment normal.   Recent Results (from the past 2160 hour(s))  COMPLETE METABOLIC PANEL WITH GFR     Status: Abnormal   Collection Time: 08/18/18 11:51 AM  Result Value Ref Range   Glucose, Bld 203 (H) 65 - 99 mg/dL    Comment: .            Fasting reference interval . For someone without known diabetes, a glucose value >125 mg/dL indicates that they may have diabetes and this should be confirmed with a follow-up test. .    BUN 27 (H) 7 - 25 mg/dL   Creat 1.61 (H) 0.96 - 1.05 mg/dL    Comment: For patients >63 years of age, the reference limit for Creatinine is approximately 13% higher for people identified as African-American. .    GFR, Est Non African American 41 (L)  > OR = 60 mL/min/1.64m2   GFR, Est African American 47 (L) > OR = 60 mL/min/1.40m2   BUN/Creatinine Ratio 19 6 - 22 (calc)   Sodium 141 135 - 146 mmol/L   Potassium 4.7 3.5 - 5.3 mmol/L   Chloride 105 98 - 110 mmol/L   CO2 29 20 - 32 mmol/L   Calcium 9.6 8.6 - 10.4 mg/dL   Total Protein 6.8 6.1 - 8.1 g/dL   Albumin 4.1 3.6 - 5.1 g/dL   Globulin 2.7 1.9 - 3.7 g/dL (calc)   AG Ratio 1.5 1.0 - 2.5 (calc)   Total Bilirubin 0.6 0.2 - 1.2 mg/dL   Alkaline phosphatase (APISO) 53 33 - 130 U/L   AST 12 10 - 35 U/L   ALT 11 6 - 29 U/L  Hemoglobin A1c     Status: Abnormal   Collection Time: 08/18/18 11:51 AM  Result Value Ref Range   Hgb A1c MFr Bld 10.2 (H) <5.7 % of total Hgb    Comment: For someone without known diabetes, a hemoglobin A1c value of 6.5% or greater indicates that they may have  diabetes and this should be confirmed with a follow-up  test. . For someone with known diabetes, a value <7% indicates  that their diabetes is well controlled and a value  greater than or equal to 7% indicates suboptimal  control. A1c targets should be individualized based on  duration of diabetes, age, comorbid conditions, and  other considerations. . Currently, no consensus exists regarding use of hemoglobin A1c for diagnosis of diabetes for children. .    Mean Plasma Glucose 246 (calc)   eAG (mmol/L) 13.6 (calc)     Assessment & Plan:   1. Type 2 diabetes mellitus with stage 3 chronic kidney disease, with long-term current use of insulin (HCC)  - Ana Wallace has currently uncontrolled symptomatic type 2 DM since 57 years of age. She returns with persistently above target glycemic profile, missed her last appointment, her recent A1c was 10.2%.    -her diabetes is complicated by stage 2-3 renal insufficiency and Ana Wallace remains at a high risk for more acute and chronic complications which include CAD, CVA, CKD, retinopathy, and neuropathy. These are all discussed in detail with  the patient.  -  I have counseled her on diet management and weight loss, by adopting a carbohydrate restricted/protein rich diet. -She still admits to dietary indiscretions including consumption of sweetened beverages.  -  Suggestion is made for her to avoid simple carbohydrates  from her diet including Cakes, Sweet Desserts / Pastries, Ice Cream, Soda (diet and regular), Sweet Tea, Candies, Chips, Cookies, Store Bought Juices, Alcohol in Excess of  1-2 drinks a day, Artificial Sweeteners, and "Sugar-free" Products. This will help patient to have stable blood glucose profile and potentially avoid unintended weight gain.  - I encouraged her to switch to  unprocessed or minimally processed complex starch and increased protein intake (animal or plant source), fruits, and vegetables.  - she is advised to stick to a routine mealtimes to eat 3 meals  a day and avoid unnecessary snacks ( to snack only to correct hypoglycemia).    - I have approached her with the following individualized plan to manage diabetes and patient agrees:   - Based on her presentation with persistent hyperglycemia and A1c of 10.2% she will require intensive treatment with basal/bolus insulin in order for her to achieve and maintain control of diabetes to target.     -She is advised to continue Lantus 50 units nightly, discussed and initiated NovoLog 10-16 units 3 times a day with meals for pre-meal blood glucose readings above 90 mg/dL, associated with strict monitoring of blood glucose 4 times a day- before meals and at bedtime.  A sample of NovoLog in clinic is given to her and prescription to take to her medications assistance  programs.   -Patient is not a candidate for SGLT2 inhibitors due to CKD.  - Patient specific target  A1c;  LDL, HDL, Triglycerides, and  Waist Circumference were discussed in detail.  2) BP/HTN: Her blood pressure is controlled to target.   She is on azilsartan.  She is advised to be consistent with  her blood pressure medications and salt restrictions.  3) Lipids/HPL: She has severe dyslipidemia with LDL 200+.  She reports statin intolerance, advised to avoid better and fried food.  She is a candidate for Repatha, however she does not have insurance currently.  She has not started her Niaspan for unclear reasons.  She is advised to fill and continue Niaspan 500 mg p.o. nightly.   4)  Weight/Diet: CDE Consult is in progress, exercise, and detailed carbohydrates information provided.  5) Chronic Care/Health Maintenance:  -she  is on ARB and Statin medications and  is encouraged to continue to follow up with Ophthalmology, Dentist,  Podiatrist at least yearly or according to recommendations, and advised to  stay away from smoking. I have recommended yearly flu vaccine and pneumonia vaccination at least every 5 years; moderate intensity exercise for up to 150 minutes weekly; and  sleep for at least 7 hours a day.  - I advised patient to maintain close follow up with [facility] for primary care needs.  - Time spent with the patient: 25 min, of which >50% was spent in reviewing her blood glucose logs , discussing her hypo- and hyper-glycemic episodes, reviewing her current and  previous labs and insulin doses and developing a plan to avoid hypo- and hyper-glycemia. Please refer to Patient Instructions for Blood Glucose Monitoring and Insulin/Medications Dosing Guide"  in media tab for additional information. Ana Wallace participated in the discussions, expressed understanding, and voiced agreement with the above plans.  All questions were answered to her satisfaction. she is  encouraged to contact clinic should she have any questions or concerns prior to her return visit.    Follow up plan: - Return in about 2 weeks (around 10/27/2018) for Follow up with Meter and Logs Only - no Labs.  Marquis Lunch, MD [facility] [address] [place], [place] [address] Phone: [phone]  Fax: [phone]    10/13/2018, 12:56 PM  This note was partially dictated with voice recognition software. Similar sounding words can be transcribed inadequately or may not  be corrected upon review.

## 2018-10-13 NOTE — Patient Instructions (Signed)

## 2018-10-28 ENCOUNTER — Ambulatory Visit (INDEPENDENT_AMBULATORY_CARE_PROVIDER_SITE_OTHER): Payer: Medicaid Other | Admitting: "Endocrinology

## 2018-10-28 ENCOUNTER — Encounter: Payer: Self-pay | Admitting: "Endocrinology

## 2018-10-28 VITALS — BP 136/76 | HR 88 | Ht 61.0 in | Wt 174.0 lb

## 2018-10-28 DIAGNOSIS — E6609 Other obesity due to excess calories: Secondary | ICD-10-CM

## 2018-10-28 DIAGNOSIS — E782 Mixed hyperlipidemia: Secondary | ICD-10-CM

## 2018-10-28 DIAGNOSIS — Z794 Long term (current) use of insulin: Secondary | ICD-10-CM

## 2018-10-28 DIAGNOSIS — E1122 Type 2 diabetes mellitus with diabetic chronic kidney disease: Secondary | ICD-10-CM

## 2018-10-28 DIAGNOSIS — Z6832 Body mass index (BMI) 32.0-32.9, adult: Secondary | ICD-10-CM

## 2018-10-28 DIAGNOSIS — N183 Chronic kidney disease, stage 3 unspecified: Secondary | ICD-10-CM

## 2018-10-28 DIAGNOSIS — I10 Essential (primary) hypertension: Secondary | ICD-10-CM

## 2018-10-28 NOTE — Patient Instructions (Signed)

## 2018-10-28 NOTE — Progress Notes (Signed)
Endocrinology follow-up note       10/28/2018, 11:08 AM   Subjective:    Patient ID: Ana Wallace, female    DOB: [birth year].  Ana Wallace is being seen in follow up  for management of currently uncontrolled symptomatic type 2 diabetes, hyperlipidemia, hypertension requested by  [facility]   Past Medical History:  Diagnosis Date  . Diabetes mellitus, type II (HCC)   . Hyperlipidemia   . Hypertension    Past Surgical History:  Procedure Laterality Date  . ABDOMINAL HYSTERECTOMY    . VAGINAL PROLAPSE REPAIR     Social History   Socioeconomic History  . Marital status: Single    Spouse name: Not on file  . Number of children: Not on file  . Years of education: Not on file  . Highest education level: Not on file  Occupational History  . Not on file  Social Needs  . Financial resource strain: Not on file  . Food insecurity:    Worry: Not on file    Inability: Not on file  . Transportation needs:    Medical: Not on file    Non-medical: Not on file  Tobacco Use  . Smoking status: Never Smoker  . Smokeless tobacco: Never Used  Substance and Sexual Activity  . Alcohol use: Never    Frequency: Never  . Drug use: Never  . Sexual activity: Never  Lifestyle  . Physical activity:    Days per week: Not on file    Minutes per session: Not on file  . Stress: Not on file  Relationships  . Social connections:    Talks on phone: Not on file    Gets together: Not on file    Attends religious service: Not on file    Active member of club or organization: Not on file    Attends meetings of clubs or organizations: Not on file    Relationship status: Not on file  Other Topics Concern  . Not on file  Social History Narrative  . Not on file   Outpatient Encounter Medications as of 10/28/2018  Medication Sig  . busPIRone (BUSPAR) 5 MG tablet Take 5 mg by mouth 2  (two) times daily.  . DULoxetine (CYMBALTA) 60 MG capsule Take 60 mg by mouth daily.  . insulin aspart (NOVOLOG FLEXPEN) 100 UNIT/ML FlexPen Inject 10-16 Units into the skin 3 (three) times daily with meals.  Marland Kitchen LANTUS 100 UNIT/ML injection Inject 40 Units into the skin at bedtime.  Marland Kitchen losartan (COZAAR) 25 MG tablet Take 25 mg by mouth daily.  . pregabalin (LYRICA) 150 MG capsule Take 150 mg by mouth 2 (two) times daily.  . Vitamin D, Ergocalciferol, (DRISDOL) 50000 units CAPS capsule Take 50,000 Units by mouth every 7 (seven) days.  . [DISCONTINUED] niacin (NIASPAN) 500 MG CR tablet Take 1 tablet (500 mg total) by mouth at bedtime. (Patient not taking: Reported on 10/13/2018)   No facility-administered encounter medications on file as of 10/28/2018.     ALLERGIES: Allergies  Allergen Reactions  . Nickel Other (See Comments)    Rash with burning sensation  VACCINATION STATUS:  There is no immunization history on file for this patient.  Diabetes  She presents for her follow-up diabetic visit. She has type 2 diabetes mellitus. Onset time: She was diagnosed at approximate age of 47 years. Her disease course has been improving. There are no hypoglycemic associated symptoms. Pertinent negatives for hypoglycemia include no confusion, headaches, pallor or seizures. Pertinent negatives for diabetes include no chest pain, no polydipsia, no polyphagia and no polyuria. There are no hypoglycemic complications. Symptoms are improving. Diabetic complications include nephropathy. Current diabetic treatment includes insulin injections and oral agent (monotherapy). Her weight is fluctuating minimally. She is following a generally unhealthy diet. When asked about meal planning, she reported none. She has had a previous visit with a dietitian. She never participates in exercise. Her breakfast blood glucose range is generally 130-140 mg/dl. Her lunch blood glucose range is generally 140-180 mg/dl. Her dinner  blood glucose range is generally 140-180 mg/dl. Her bedtime blood glucose range is generally 140-180 mg/dl. Her overall blood glucose range is 140-180 mg/dl. An ACE inhibitor/angiotensin II receptor blocker is being taken.  Hyperlipidemia  This is a chronic problem. The current episode started more than 1 year ago. The problem is uncontrolled. Exacerbating diseases include chronic renal disease and diabetes. Pertinent negatives include no chest pain, myalgias or shortness of breath. She is currently on no antihyperlipidemic treatment (She reports side effects from statin medications.). The current treatment provides no improvement of lipids. Risk factors for coronary artery disease include dyslipidemia, diabetes mellitus, a sedentary lifestyle, post-menopausal and obesity.  Hypertension  This is a chronic problem. The current episode started more than 1 year ago. The problem is controlled. Pertinent negatives include no chest pain, headaches, palpitations or shortness of breath. Risk factors for coronary artery disease include dyslipidemia and diabetes mellitus. Past treatments include angiotensin blockers. Identifiable causes of hypertension include chronic renal disease.    Review of Systems  Constitutional: Negative for chills, fever and unexpected weight change.  HENT: Negative for trouble swallowing and voice change.   Eyes: Negative for visual disturbance.  Respiratory: Negative for cough, shortness of breath and wheezing.   Cardiovascular: Negative for chest pain, palpitations and leg swelling.  Gastrointestinal: Negative for diarrhea, nausea and vomiting.  Endocrine: Negative for cold intolerance, heat intolerance, polydipsia, polyphagia and polyuria.  Musculoskeletal: Negative for arthralgias and myalgias.  Skin: Negative for color change, pallor, rash and wound.  Neurological: Negative for seizures and headaches.  Psychiatric/Behavioral: Negative for confusion and suicidal ideas.     Objective:    BP 136/76   Pulse 88   Ht 5\' 1"  (1.549 m)   Wt 174 lb (78.9 kg)   BMI 32.88 kg/m   Wt Readings from Last 3 Encounters:  10/28/18 174 lb (78.9 kg)  10/13/18 173 lb (78.5 kg)  08/28/18 169 lb (76.7 kg)     Physical Exam  Constitutional: She is oriented to person, place, and time. She appears well-developed.  HENT:  Head: Normocephalic and atraumatic.  Eyes: EOM are normal.  Neck: Normal range of motion. Neck supple. No tracheal deviation present. No thyromegaly present.  Cardiovascular: Normal rate.  Pulmonary/Chest: Effort normal.  Abdominal: There is no tenderness. There is no guarding.  Musculoskeletal: Normal range of motion. She exhibits no edema.  Neurological: She is alert and oriented to person, place, and time. No cranial nerve deficit. Coordination normal.  Skin: Skin is warm and dry. No rash noted. No erythema. No pallor.  Psychiatric: She has a normal mood  and affect. Judgment normal.   Recent Results (from the past 2160 hour(s))  COMPLETE METABOLIC PANEL WITH GFR     Status: Abnormal   Collection Time: 08/18/18 11:51 AM  Result Value Ref Range   Glucose, Bld 203 (H) 65 - 99 mg/dL    Comment: .            Fasting reference interval . For someone without known diabetes, a glucose value >125 mg/dL indicates that they may have diabetes and this should be confirmed with a follow-up test. .    BUN 27 (H) 7 - 25 mg/dL   Creat 1.61 (H) 0.96 - 1.05 mg/dL    Comment: For patients >52 years of age, the reference limit for Creatinine is approximately 13% higher for people identified as African-American. .    GFR, Est Non African American 41 (L) > OR = 60 mL/min/1.26m2   GFR, Est African American 47 (L) > OR = 60 mL/min/1.58m2   BUN/Creatinine Ratio 19 6 - 22 (calc)   Sodium 141 135 - 146 mmol/L   Potassium 4.7 3.5 - 5.3 mmol/L   Chloride 105 98 - 110 mmol/L   CO2 29 20 - 32 mmol/L   Calcium 9.6 8.6 - 10.4 mg/dL   Total Protein 6.8 6.1 - 8.1  g/dL   Albumin 4.1 3.6 - 5.1 g/dL   Globulin 2.7 1.9 - 3.7 g/dL (calc)   AG Ratio 1.5 1.0 - 2.5 (calc)   Total Bilirubin 0.6 0.2 - 1.2 mg/dL   Alkaline phosphatase (APISO) 53 33 - 130 U/L   AST 12 10 - 35 U/L   ALT 11 6 - 29 U/L  Hemoglobin A1c     Status: Abnormal   Collection Time: 08/18/18 11:51 AM  Result Value Ref Range   Hgb A1c MFr Bld 10.2 (H) <5.7 % of total Hgb    Comment: For someone without known diabetes, a hemoglobin A1c value of 6.5% or greater indicates that they may have  diabetes and this should be confirmed with a follow-up  test. . For someone with known diabetes, a value <7% indicates  that their diabetes is well controlled and a value  greater than or equal to 7% indicates suboptimal  control. A1c targets should be individualized based on  duration of diabetes, age, comorbid conditions, and  other considerations. . Currently, no consensus exists regarding use of hemoglobin A1c for diagnosis of diabetes for children. .    Mean Plasma Glucose 246 (calc)   eAG (mmol/L) 13.6 (calc)     Assessment & Plan:   1. Type 2 diabetes mellitus with stage 3 chronic kidney disease, with long-term current use of insulin (HCC)  - Ana Wallace has currently uncontrolled symptomatic type 2 DM since 57 years of age. She returns with significantly improved glycemic profile on intensive treatment with basal/bolus insulin.  Her recent A1c was 10.2%.    -her diabetes is complicated by stage 2-3 renal insufficiency and Ana Wallace remains at a high risk for more acute and chronic complications which include CAD, CVA, CKD, retinopathy, and neuropathy. These are all discussed in detail with the patient.  - I have counseled her on diet management and weight loss, by adopting a carbohydrate restricted/protein rich diet. -She still admits to dietary indiscretions including consumption of sweetened beverages.  -  Suggestion is made for her to avoid simple carbohydrates  from  her diet including Cakes, Sweet Desserts / Pastries, Ice Cream, Soda (diet and regular), Sweet Tea, Candies,  Chips, Cookies, Store Bought Juices, Alcohol in Excess of  1-2 drinks a day, Artificial Sweeteners, and "Sugar-free" Products. This will help patient to have stable blood glucose profile and potentially avoid unintended weight gain.   - I encouraged her to switch to  unprocessed or minimally processed complex starch and increased protein intake (animal or plant source), fruits, and vegetables.  - she is advised to stick to a routine mealtimes to eat 3 meals  a day and avoid unnecessary snacks ( to snack only to correct hypoglycemia).    - I have approached her with the following individualized plan to manage diabetes and patient agrees:   -She presents with tightly controlled fasting blood glucose profile and significantly improved postprandial glycemic profile.   -I discussed and lowered her Lantus to 40 units nightly, advised her to continue NovoLog 10-16 units 3 times a day with meals for pre-meal blood glucose readings above 90 mg/dL, associated with strict monitoring of blood glucose 4 times a day- before meals and at bedtime.   -Patient is not a candidate for SGLT2 inhibitors due to CKD.  - Patient specific target  A1c;  LDL, HDL, Triglycerides, and  Waist Circumference were discussed in detail.  2) BP/HTN: Her blood pressure is controlled to target.    She is on azilsartan.  She is advised to be consistent with her blood pressure medications and salt restrictions.  3) Lipids/HPL: She has severe dyslipidemia with LDL 200+.  She reports statin intolerance, advised to avoid better and fried food.  She is a candidate for Repatha, however she does not have insurance currently.  She has not started her Niaspan for unclear reasons.  She is advised to fill and continue Niaspan 500 mg p.o. nightly.   4)  Weight/Diet: CDE Consult is in progress, exercise, and detailed carbohydrates  information provided.  5) Chronic Care/Health Maintenance:  -she  is on ARB and Statin medications and  is encouraged to continue to follow up with Ophthalmology, Dentist,  Podiatrist at least yearly or according to recommendations, and advised to  stay away from smoking. I have recommended yearly flu vaccine and pneumonia vaccination at least every 5 years; moderate intensity exercise for up to 150 minutes weekly; and  sleep for at least 7 hours a day.  - I advised patient to maintain close follow up with [facility] for primary care needs. - Time spent with the patient: 25 min, of which >50% was spent in reviewing her blood glucose logs , discussing her hypo- and hyper-glycemic episodes, reviewing her current and  previous labs and insulin doses and developing a plan to avoid hypo- and hyper-glycemia. Please refer to Patient Instructions for Blood Glucose Monitoring and Insulin/Medications Dosing Guide"  in media tab for additional information. Ana Wallace participated in the discussions, expressed understanding, and voiced agreement with the above plans.  All questions were answered to her satisfaction. she is encouraged to contact clinic should she have any questions or concerns prior to her return visit.   Follow up plan: - Return in about 5 weeks (around 12/02/2018) for Meter, and Logs.  Marquis Lunch, MD [facility] [address] [place], [place] [address] Phone: [phone]  Fax: [phone]    10/28/2018, 11:08 AM  This note was partially dictated with voice recognition software. Similar sounding words can be transcribed inadequately or may not  be corrected upon review.

## 2018-12-04 LAB — COMPLETE METABOLIC PANEL WITH GFR
AG RATIO: 1.5 (calc) (ref 1.0–2.5)
ALKALINE PHOSPHATASE (APISO): 56 U/L (ref 33–130)
ALT: 16 U/L (ref 6–29)
AST: 18 U/L (ref 10–35)
Albumin: 4 g/dL (ref 3.6–5.1)
BILIRUBIN TOTAL: 0.6 mg/dL (ref 0.2–1.2)
BUN/Creatinine Ratio: 21 (calc) (ref 6–22)
BUN: 26 mg/dL — ABNORMAL HIGH (ref 7–25)
CHLORIDE: 104 mmol/L (ref 98–110)
CO2: 29 mmol/L (ref 20–32)
Calcium: 9.4 mg/dL (ref 8.6–10.4)
Creat: 1.22 mg/dL — ABNORMAL HIGH (ref 0.50–1.05)
GFR, EST AFRICAN AMERICAN: 58 mL/min/{1.73_m2} — AB (ref >=60)
GFR, Est Non African American: 50 mL/min/{1.73_m2} — ABNORMAL LOW (ref >=60)
Globulin: 2.7 g/dL (calc) (ref 1.9–3.7)
Glucose, Bld: 145 mg/dL — ABNORMAL HIGH (ref 65–99)
POTASSIUM: 4.2 mmol/L (ref 3.5–5.3)
Sodium: 140 mmol/L (ref 135–146)
TOTAL PROTEIN: 6.7 g/dL (ref 6.1–8.1)

## 2018-12-04 LAB — HEMOGLOBIN A1C
EAG (MMOL/L): 8.5 (calc)
HEMOGLOBIN A1C: 7 %{Hb} — AB (ref <5.7)
MEAN PLASMA GLUCOSE: 154 (calc)

## 2018-12-10 ENCOUNTER — Encounter: Payer: Self-pay | Admitting: "Endocrinology

## 2018-12-10 ENCOUNTER — Encounter: Payer: Medicaid Other | Attending: "Endocrinology | Admitting: Nutrition

## 2018-12-10 ENCOUNTER — Ambulatory Visit (INDEPENDENT_AMBULATORY_CARE_PROVIDER_SITE_OTHER): Payer: Medicaid Other | Admitting: "Endocrinology

## 2018-12-10 VITALS — BP 152/75 | HR 80 | Ht 61.0 in | Wt 180.0 lb

## 2018-12-10 DIAGNOSIS — E6609 Other obesity due to excess calories: Secondary | ICD-10-CM

## 2018-12-10 DIAGNOSIS — IMO0002 Reserved for concepts with insufficient information to code with codable children: Secondary | ICD-10-CM

## 2018-12-10 DIAGNOSIS — N183 Chronic kidney disease, stage 3 unspecified: Secondary | ICD-10-CM

## 2018-12-10 DIAGNOSIS — E118 Type 2 diabetes mellitus with unspecified complications: Secondary | ICD-10-CM | POA: Insufficient documentation

## 2018-12-10 DIAGNOSIS — Z794 Long term (current) use of insulin: Secondary | ICD-10-CM

## 2018-12-10 DIAGNOSIS — E1165 Type 2 diabetes mellitus with hyperglycemia: Secondary | ICD-10-CM

## 2018-12-10 DIAGNOSIS — Z6832 Body mass index (BMI) 32.0-32.9, adult: Secondary | ICD-10-CM

## 2018-12-10 DIAGNOSIS — E1122 Type 2 diabetes mellitus with diabetic chronic kidney disease: Secondary | ICD-10-CM

## 2018-12-10 DIAGNOSIS — E66811 Obesity, class 1: Secondary | ICD-10-CM

## 2018-12-10 DIAGNOSIS — E559 Vitamin D deficiency, unspecified: Secondary | ICD-10-CM

## 2018-12-10 DIAGNOSIS — E782 Mixed hyperlipidemia: Secondary | ICD-10-CM

## 2018-12-10 DIAGNOSIS — I10 Essential (primary) hypertension: Secondary | ICD-10-CM

## 2018-12-10 NOTE — Progress Notes (Signed)
Endocrinology follow-up note       12/10/2018, 2:11 PM   Subjective:    Patient ID: Ana Wallace, female    DOB: [birth year].  Ana Wallace is being seen in follow up  for management of currently uncontrolled symptomatic type 2 diabetes, hyperlipidemia, hypertension requested by  [facility]   Past Medical History:  Diagnosis Date  . Diabetes mellitus, type II (HCC)   . Hyperlipidemia   . Hypertension    Past Surgical History:  Procedure Laterality Date  . ABDOMINAL HYSTERECTOMY    . VAGINAL PROLAPSE REPAIR     Social History   Socioeconomic History  . Marital status: Single    Spouse name: Not on file  . Number of children: Not on file  . Years of education: Not on file  . Highest education level: Not on file  Occupational History  . Not on file  Social Needs  . Financial resource strain: Not on file  . Food insecurity:    Worry: Not on file    Inability: Not on file  . Transportation needs:    Medical: Not on file    Non-medical: Not on file  Tobacco Use  . Smoking status: Never Smoker  . Smokeless tobacco: Never Used  Substance and Sexual Activity  . Alcohol use: Never    Frequency: Never  . Drug use: Never  . Sexual activity: Never  Lifestyle  . Physical activity:    Days per week: Not on file    Minutes per session: Not on file  . Stress: Not on file  Relationships  . Social connections:    Talks on phone: Not on file    Gets together: Not on file    Attends religious service: Not on file    Active member of club or organization: Not on file    Attends meetings of clubs or organizations: Not on file    Relationship status: Not on file  Other Topics Concern  . Not on file  Social History Narrative  . Not on file   Outpatient Encounter Medications as of 12/10/2018  Medication Sig  . busPIRone (BUSPAR) 5 MG tablet Take 5 mg by mouth 2  (two) times daily.  . DULoxetine (CYMBALTA) 60 MG capsule Take 60 mg by mouth daily.  . insulin aspart (NOVOLOG FLEXPEN) 100 UNIT/ML FlexPen Inject 10-16 Units into the skin 3 (three) times daily with meals.  Marland Kitchen LANTUS 100 UNIT/ML injection Inject 40 Units into the skin at bedtime.  Marland Kitchen losartan (COZAAR) 25 MG tablet Take 25 mg by mouth daily.  . pregabalin (LYRICA) 150 MG capsule Take 150 mg by mouth 2 (two) times daily.  . Vitamin D, Ergocalciferol, (DRISDOL) 50000 units CAPS capsule Take 50,000 Units by mouth every 7 (seven) days.   No facility-administered encounter medications on file as of 12/10/2018.     ALLERGIES: Allergies  Allergen Reactions  . Nickel Other (See Comments)    Rash with burning sensation    VACCINATION STATUS:  There is no immunization history on file for this patient.  Diabetes  She presents for her follow-up diabetic  visit. She has type 2 diabetes mellitus. Onset time: She was diagnosed at approximate age of 47 years. Her disease course has been improving. There are no hypoglycemic associated symptoms. Pertinent negatives for hypoglycemia include no confusion, headaches, pallor or seizures. Pertinent negatives for diabetes include no chest pain, no polydipsia, no polyphagia and no polyuria. There are no hypoglycemic complications. Symptoms are improving. Diabetic complications include nephropathy. Current diabetic treatment includes insulin injections and oral agent (monotherapy). Her weight is fluctuating minimally. She is following a generally unhealthy diet. When asked about meal planning, she reported none. She has had a previous visit with a dietitian. She never participates in exercise. Her breakfast blood glucose range is generally 130-140 mg/dl. Her lunch blood glucose range is generally 140-180 mg/dl. Her dinner blood glucose range is generally 140-180 mg/dl. Her bedtime blood glucose range is generally 140-180 mg/dl. Her overall blood glucose range is 140-180  mg/dl. An ACE inhibitor/angiotensin II receptor blocker is being taken.  Hyperlipidemia  This is a chronic problem. The current episode started more than 1 year ago. The problem is uncontrolled. Exacerbating diseases include chronic renal disease and diabetes. Pertinent negatives include no chest pain, myalgias or shortness of breath. She is currently on no antihyperlipidemic treatment (She reports side effects from statin medications.). The current treatment provides no improvement of lipids. Risk factors for coronary artery disease include dyslipidemia, diabetes mellitus, a sedentary lifestyle, post-menopausal and obesity.  Hypertension  This is a chronic problem. The current episode started more than 1 year ago. The problem is controlled. Pertinent negatives include no chest pain, headaches, palpitations or shortness of breath. Risk factors for coronary artery disease include dyslipidemia and diabetes mellitus. Past treatments include angiotensin blockers. Identifiable causes of hypertension include chronic renal disease.    Review of Systems  Constitutional: Negative for chills, fever and unexpected weight change.  HENT: Negative for trouble swallowing and voice change.   Eyes: Negative for visual disturbance.  Respiratory: Negative for cough, shortness of breath and wheezing.   Cardiovascular: Negative for chest pain, palpitations and leg swelling.  Gastrointestinal: Negative for diarrhea, nausea and vomiting.  Endocrine: Negative for cold intolerance, heat intolerance, polydipsia, polyphagia and polyuria.  Musculoskeletal: Negative for arthralgias and myalgias.  Skin: Negative for color change, pallor, rash and wound.  Neurological: Negative for seizures and headaches.  Psychiatric/Behavioral: Negative for confusion and suicidal ideas.    Objective:    BP (!) 152/75   Pulse 80   Ht 5\' 1"  (1.549 m)   Wt 180 lb (81.6 kg)   BMI 34.01 kg/m   Wt Readings from Last 3 Encounters:   12/10/18 180 lb (81.6 kg)  10/28/18 174 lb (78.9 kg)  10/13/18 173 lb (78.5 kg)     Physical Exam  Constitutional: She is oriented to person, place, and time. She appears well-developed.  HENT:  Head: Normocephalic and atraumatic.  Eyes: EOM are normal.  Neck: Normal range of motion. Neck supple. No tracheal deviation present. No thyromegaly present.  Cardiovascular: Normal rate.  Pulmonary/Chest: Effort normal.  Abdominal: There is no tenderness. There is no guarding.  Musculoskeletal: Normal range of motion. She exhibits no edema.  Neurological: She is alert and oriented to person, place, and time. No cranial nerve deficit. Coordination normal.  Skin: Skin is warm and dry. No rash noted. No erythema. No pallor.  Psychiatric: She has a normal mood and affect. Judgment normal.   Recent Results (from the past 2160 hour(s))  Hemoglobin A1c     Status:  Abnormal   Collection Time: 12/03/18  9:53 AM  Result Value Ref Range   Hgb A1c MFr Bld 7.0 (H) <5.7 % of total Hgb    Comment: For someone without known diabetes, a hemoglobin A1c value of 6.5% or greater indicates that they may have  diabetes and this should be confirmed with a follow-up  test. . For someone with known diabetes, a value <7% indicates  that their diabetes is well controlled and a value  greater than or equal to 7% indicates suboptimal  control. A1c targets should be individualized based on  duration of diabetes, age, comorbid conditions, and  other considerations. . Currently, no consensus exists regarding use of hemoglobin A1c for diagnosis of diabetes for children. .    Mean Plasma Glucose 154 (calc)   eAG (mmol/L) 8.5 (calc)  COMPLETE METABOLIC PANEL WITH GFR     Status: Abnormal   Collection Time: 12/03/18  9:53 AM  Result Value Ref Range   Glucose, Bld 145 (H) 65 - 99 mg/dL    Comment: .            Fasting reference interval . For someone without known diabetes, a glucose value >125 mg/dL  indicates that they may have diabetes and this should be confirmed with a follow-up test. .    BUN 26 (H) 7 - 25 mg/dL   Creat 0.981.22 (H) 1.190.50 - 1.05 mg/dL    Comment: For patients >55 years of age, the reference limit for Creatinine is approximately 13% higher for people identified as African-American. .    GFR, Est Non African American 50 (L) > OR = 60 mL/min/1.6973m2   GFR, Est African American 58 (L) > OR = 60 mL/min/1.6673m2   BUN/Creatinine Ratio 21 6 - 22 (calc)   Sodium 140 135 - 146 mmol/L   Potassium 4.2 3.5 - 5.3 mmol/L   Chloride 104 98 - 110 mmol/L   CO2 29 20 - 32 mmol/L   Calcium 9.4 8.6 - 10.4 mg/dL   Total Protein 6.7 6.1 - 8.1 g/dL   Albumin 4.0 3.6 - 5.1 g/dL   Globulin 2.7 1.9 - 3.7 g/dL (calc)   AG Ratio 1.5 1.0 - 2.5 (calc)   Total Bilirubin 0.6 0.2 - 1.2 mg/dL   Alkaline phosphatase (APISO) 56 33 - 130 U/L   AST 18 10 - 35 U/L   ALT 16 6 - 29 U/L     Assessment & Plan:   1. Type 2 diabetes mellitus with stage 3 chronic kidney disease, with long-term current use of insulin (HCC)  - Ana Wallace has currently uncontrolled symptomatic type 2 DM since 57 years of age. - She returns with significantly improved glycemic profile on intensive treatment with basal/bolus insulin.  Her previsit labs show A1c of 7%, improving from 10.2%.  Her labs also show significant improvement in her renal function.   -her diabetes is complicated by stage 2 -3 renal insufficiency (improving) and Ana Wallace remains at a high risk for more acute and chronic complications which include CAD, CVA, CKD, retinopathy, and neuropathy. These are all discussed in detail with the patient.  - I have counseled her on diet management and weight loss, by adopting a carbohydrate restricted/protein rich diet. -She still admits to dietary indiscretions including consumption of sweetened beverages.  -  Suggestion is made for her to avoid simple carbohydrates  from her diet including Cakes, Sweet  Desserts / Pastries, Ice Cream, Soda (diet and regular), Sweet Tea, Candies, Chips,  Cookies, Store Bought Juices, Alcohol in Excess of  1-2 drinks a day, Artificial Sweeteners, and "Sugar-free" Products. This will help patient to have stable blood glucose profile and potentially avoid unintended weight gain.  - I encouraged her to switch to  unprocessed or minimally processed complex starch and increased protein intake (animal or plant source), fruits, and vegetables.  - she is advised to stick to a routine mealtimes to eat 3 meals  a day and avoid unnecessary snacks ( to snack only to correct hypoglycemia).    - I have approached her with the following individualized plan to manage diabetes and patient agrees:   -She presents with tightly controlled fasting blood glucose profile and significantly improved postprandial glycemic profile.   -She is advised to continue Lantus  40 units nightly, advised her to continue NovoLog 10-16 units 3 times a day with meals for pre-meal blood glucose readings above 90 mg/dL, associated with strict monitoring of blood glucose 4 times a day- before meals and at bedtime.   -Patient is not a candidate for SGLT2 inhibitors due to CKD.  - Patient specific target  A1c;  LDL, HDL, Triglycerides, and  Waist Circumference were discussed in detail.  2) BP/HTN: Her blood pressure is controlled to target.    She is on azilsartan.  She is advised to be consistent with her blood pressure medications and salt restrictions.  3) Lipids/HPL: She has severe dyslipidemia with LDL 200+.  She reports statin intolerance, advised to avoid better and fried food.  She is a candidate for Repatha, however she does not have insurance currently.  He has not continued on Niaspan.  She will have repeat fasting lipid panel on subsequent visits.  4)  Weight/Diet: CDE Consult is in progress, exercise, and detailed carbohydrates information provided.  5) Chronic Care/Health Maintenance:  -she   is on ARB and Statin medications and  is encouraged to continue to follow up with Ophthalmology, Dentist,  Podiatrist at least yearly or according to recommendations, and advised to  stay away from smoking. I have recommended yearly flu vaccine and pneumonia vaccination at least every 5 years; moderate intensity exercise for up to 150 minutes weekly; and  sleep for at least 7 hours a day.  - I advised patient to maintain close follow up with [facility] for primary care needs.  - Time spent with the patient: 25 min, of which >50% was spent in reviewing her blood glucose logs , discussing her hypo- and hyper-glycemic episodes, reviewing her current and  previous labs and insulin doses and developing a plan to avoid hypo- and hyper-glycemia. Please refer to Patient Instructions for Blood Glucose Monitoring and Insulin/Medications Dosing Guide"  in media tab for additional information. Ana Hose participated in the discussions, expressed understanding, and voiced agreement with the above plans.  All questions were answered to her satisfaction. she is encouraged to contact clinic should she have any questions or concerns prior to her return visit.  Follow up plan: - Return in about 4 months (around 04/11/2019) for Follow up with Pre-visit Labs, Meter, and Logs.  Marquis Lunch, MD [facility] [address] [place], [place] [address] Phone: [phone]  Fax: [phone]    12/10/2018, 2:11 PM  This note was partially dictated with voice recognition software. Similar sounding words can be transcribed inadequately or may not  be corrected upon review.

## 2018-12-10 NOTE — Patient Instructions (Addendum)
Goals 1. Walk 30 mintues a day 2. Only eat fresh fruits and vegetables. 3. Cut out processed foods; meats 4. Lose 1 lb per week Read labels and avoid foods with more than 200 mg per servings

## 2018-12-10 NOTE — Progress Notes (Signed)
  Medical Nutrition Therapy:  Appt start time: 6834 end time:  1600   Assessment:  Primary concerns today: Diabetes Type 2, overweight, Stg 3 CKD Walkin from [place], Endocrinology. Barriers are fiancial and transportation. Works as a Actuary.  Has a little swelling in her legs/feet. She notes she has gained 6 lbs. Has been taken off the BP meds. Work on reducing salt and processed foods. Kidney function has improved dramatically.  A1C down to 7%. Feels better. Trying to eat better. Dr. Dorris Fetch reduced her insulin of Lantus to 40 units and Novolg 10 units with meals plus sliding today. scale. She is hoping she can get off her meal time insulin. Limited with mobility due to back issues.   Lab Results  Component Value Date   HGBA1C 7.0 (H) 12/03/2018   CMP Latest Ref Rng & Units 12/03/2018 08/18/2018  Glucose 65 - 99 mg/dL 145(H) 203(H)  BUN 7 - 25 mg/dL 26(H) 27(H)  Creatinine 0.50 - 1.05 mg/dL 1.22(H) 1.44(H)  Sodium 135 - 146 mmol/L 140 141  Potassium 3.5 - 5.3 mmol/L 4.2 4.7  Chloride 98 - 110 mmol/L 104 105  CO2 20 - 32 mmol/L 29 29  Calcium 8.6 - 10.4 mg/dL 9.4 9.6  Total Protein 6.1 - 8.1 g/dL 6.7 6.8  Total Bilirubin 0.2 - 1.2 mg/dL 0.6 0.6  AST 10 - 35 U/L 18 12  ALT 6 - 29 U/L 16 11   Lipid Panel     Component Value Date/Time   CHOL 355 (A) 01/14/2018   TRIG 187 (A) 01/14/2018   HDL 55 01/14/2018   LDLCALC 264 01/14/2018     Preferred Learning Style:   Auditory  Visual  Hands on  Learning Readiness:   Ready  Change in progress   MEDICATIONS: see list   DIETARY INTAKE:  B) Oatmeal and 1 slice toast and [place] sausage 1-2. L) 6" cold cut sub and 1/2 c applesauce. Water D) Pork chop baked,  Mac/cheese and broccoli, 1/2 c applesauce, water   Usual physical activity: ADL  Estimated energy needs: 1200 calories 135 g carbohydrates 90 g protein 33 g fat  Progress Towards Goal(s):  In progress.   Nutritional Diagnosis:  NB-1.1 Food and  nutrition-related knowledge deficit As related to Diabets.  As evidenced by A1C 10.3%.    Intervention: Nutrition and Diabetes education provided on My Plate, CHO counting, meal planning, portion sizes, timing of meals, avoiding snacks between meals unless having a low blood sugar, target ranges for A1C and blood sugars, signs/symptoms and treatment of hyper/hypoglycemia, monitoring blood sugars, taking medications as prescribed, benefits of exercising 30 minutes per day and prevention of complications of DM. Marland Kitchen Goals 1. Walk 30 mintues a day 2. Only eat fresh fruits and vegetables. 3. Cut out processed foods; meats 4. Lose 1 lb per week Read labels and avoid foods with more than 200 mg per servings   Teaching Method Utilized:  Visual Auditory Hands on  Handouts given during visit include:  The Plate Method   Renal Diet  Barriers to learning/adherence to lifestyle change: none  Demonstrated degree of understanding via:  Teach Back   Monitoring/Evaluation:  Dietary intake, exercise, nd body weight in 3 months.(s).

## 2018-12-10 NOTE — Patient Instructions (Signed)

## 2018-12-29 ENCOUNTER — Encounter: Payer: Self-pay | Admitting: Nutrition

## 2019-03-28 IMAGING — US US RENAL
1 series · 14 of 25 positions shown · non-contrast
Comparison: None.

CLINICAL DATA: Chronic kidney injury stage III

EXAM:
RENAL / URINARY TRACT ULTRASOUND COMPLETE

[Series 1: us renal · 0.21mm/px · 14 of 39 slices shown]
[im 1/39]
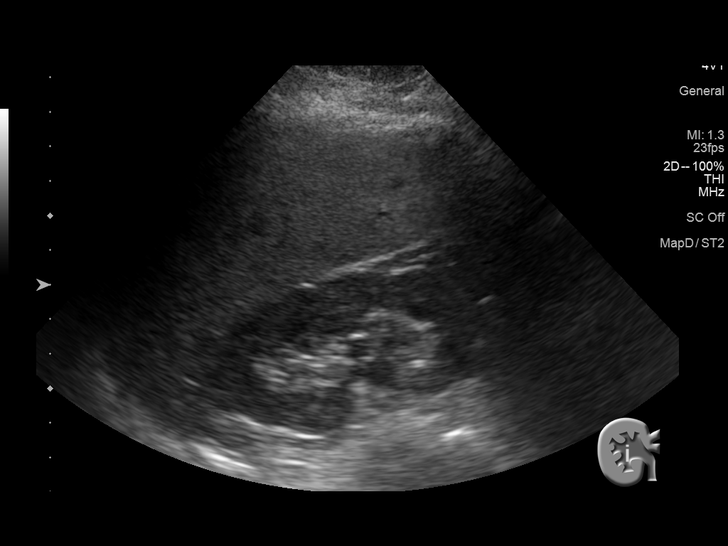
[im 4/39]
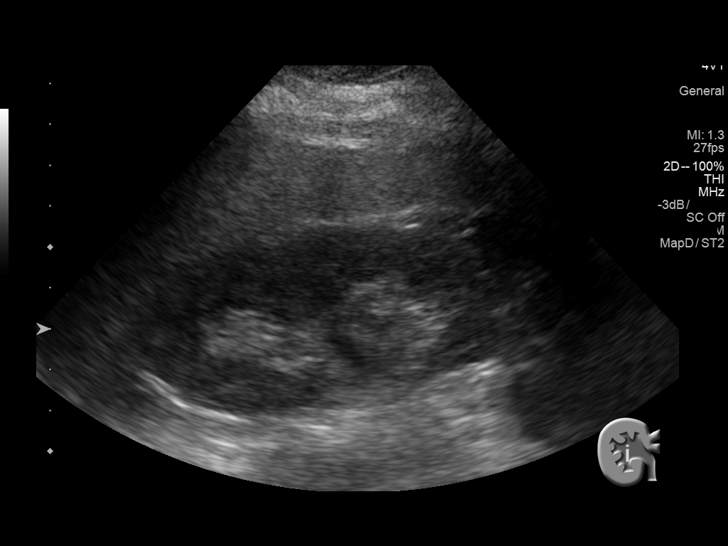
[im 7/39]
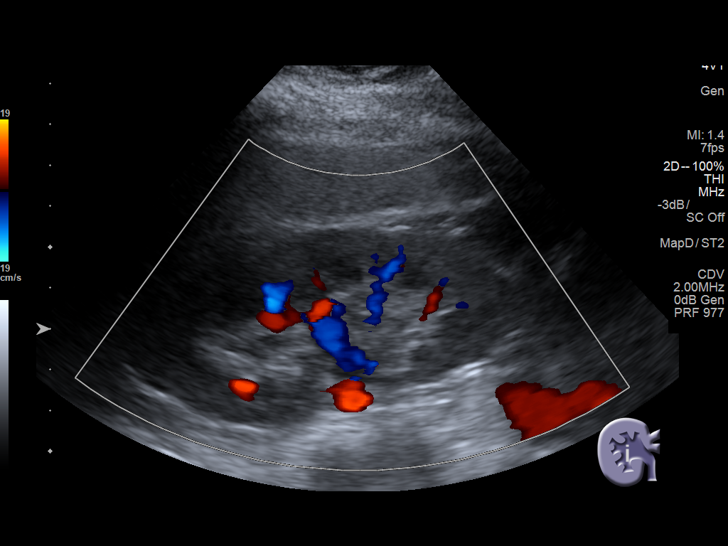
[im 10/39]
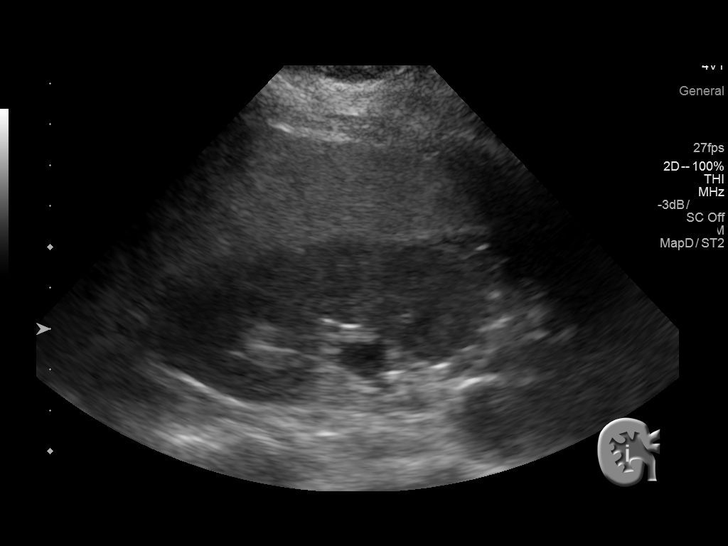
[im 13/39]
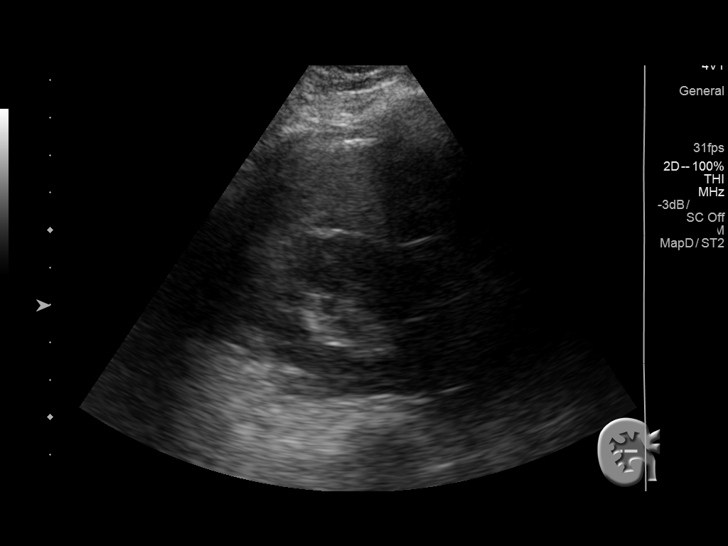
[im 15/39]
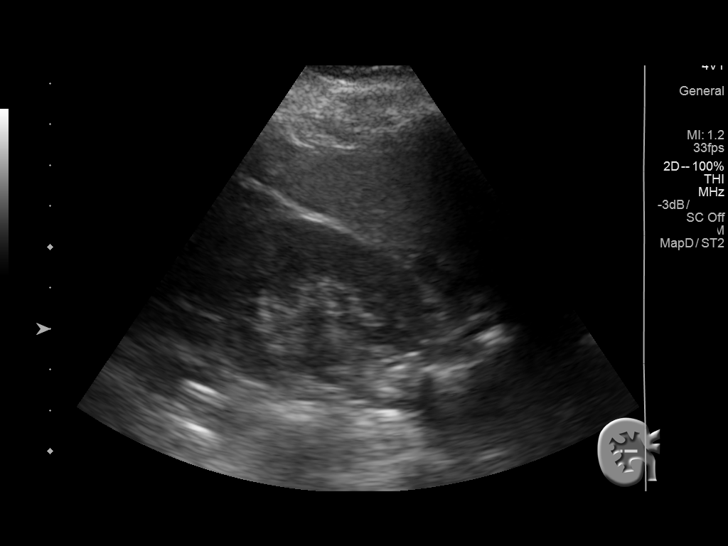
[im 18/39]
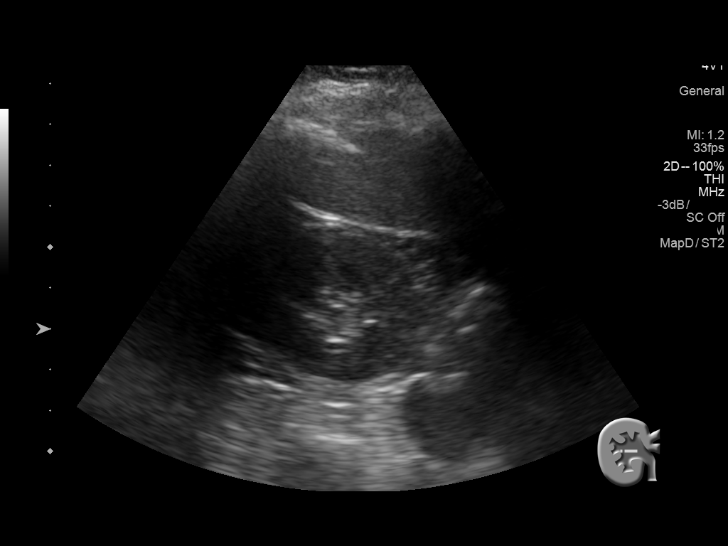
[im 21/39]
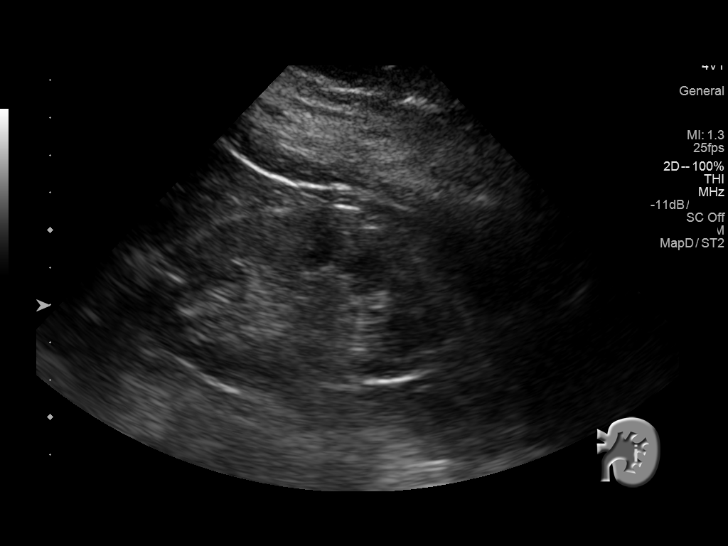
[im 24/39]
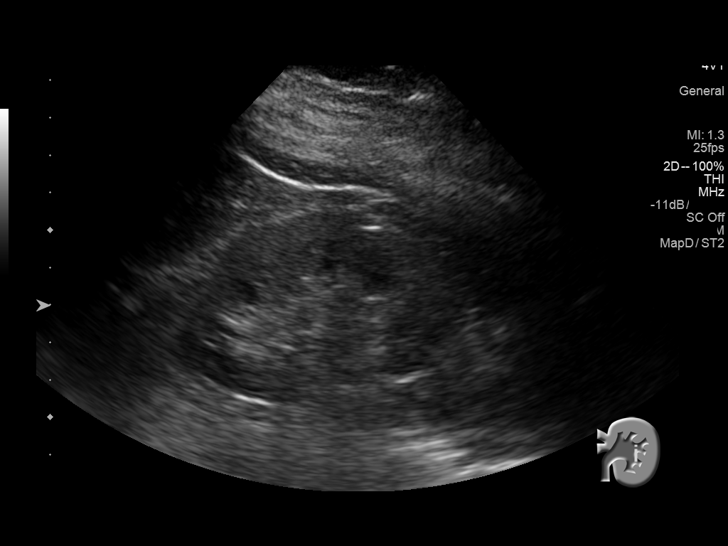
[im 26/39]
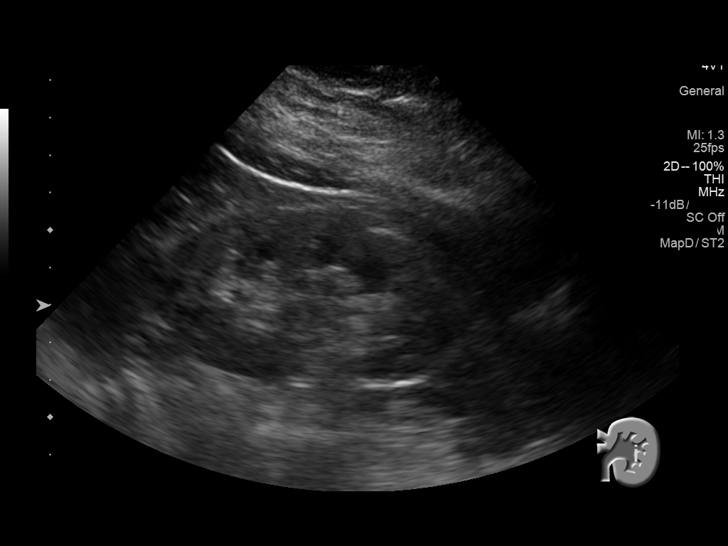
[im 29/39]
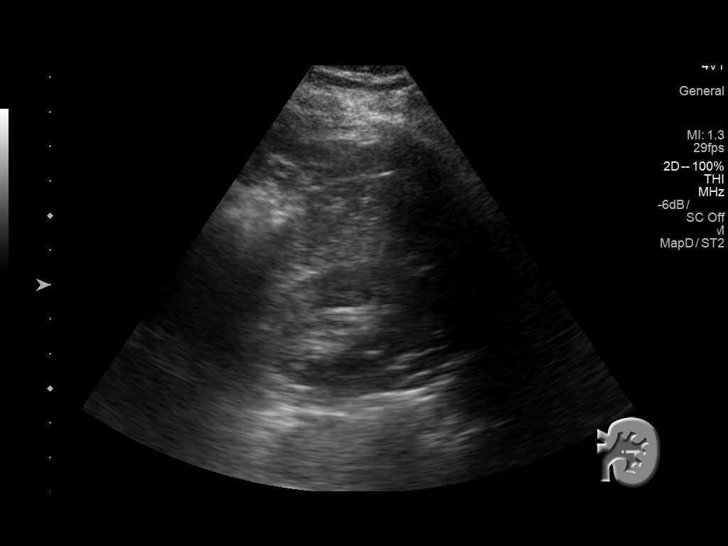
[im 32/39]
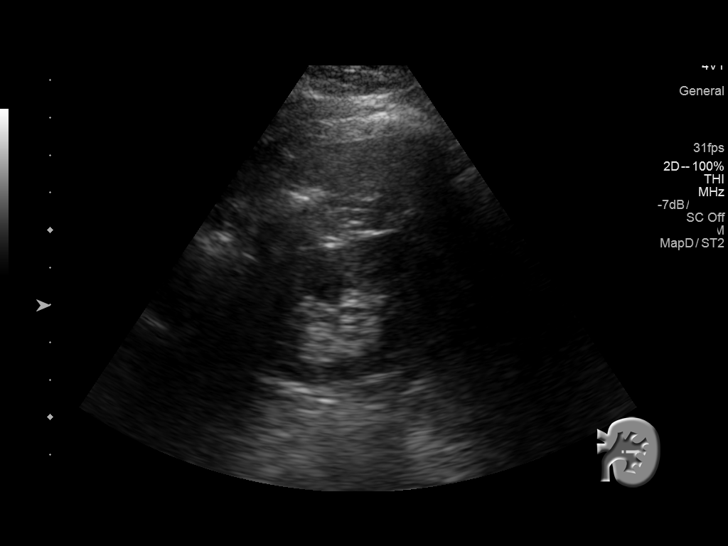
[im 35/39]
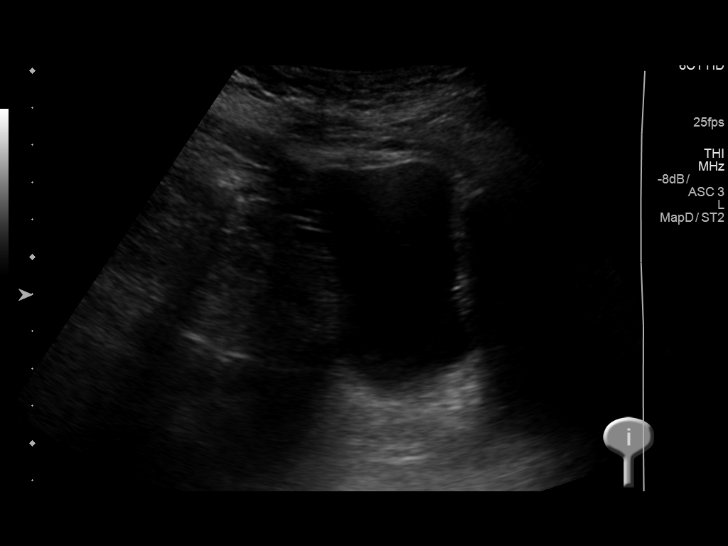
[im 39/39]
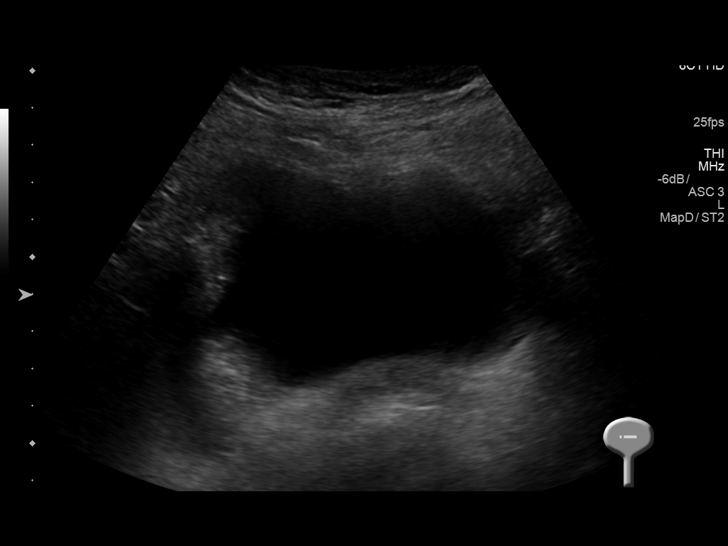

[14 of 25 positions shown; findings below may reference images not displayed]

FINDINGS: Right Kidney:

Length: 9.1 cm. Echogenicity within normal limits. No mass or
hydronephrosis visualized.

Left Kidney:

Length: 9.1 cm. Echogenicity within normal limits. No mass or
hydronephrosis visualized.

Bladder:

Appears normal for degree of bladder distention.
IMPRESSION: Negative renal ultrasound

## 2019-04-13 ENCOUNTER — Ambulatory Visit: Payer: Medicaid Other | Admitting: Nutrition

## 2019-04-15 ENCOUNTER — Other Ambulatory Visit: Payer: Self-pay

## 2019-04-15 ENCOUNTER — Encounter: Payer: Self-pay | Admitting: Nutrition

## 2019-04-15 ENCOUNTER — Encounter: Payer: Medicaid Other | Attending: Nurse Practitioner | Admitting: Nutrition

## 2019-04-15 VITALS — Wt 178.0 lb

## 2019-04-15 DIAGNOSIS — E118 Type 2 diabetes mellitus with unspecified complications: Secondary | ICD-10-CM | POA: Insufficient documentation

## 2019-04-15 DIAGNOSIS — IMO0002 Reserved for concepts with insufficient information to code with codable children: Secondary | ICD-10-CM

## 2019-04-15 DIAGNOSIS — E1165 Type 2 diabetes mellitus with hyperglycemia: Secondary | ICD-10-CM

## 2019-04-15 DIAGNOSIS — N181 Chronic kidney disease, stage 1: Secondary | ICD-10-CM | POA: Insufficient documentation

## 2019-04-15 NOTE — Patient Instructions (Signed)
Goals Eat three meals per day and don't skip lunch Take insulin as prescribed. Cut out sweet and processed foods. Avoid salty foods.

## 2019-04-15 NOTE — Progress Notes (Signed)
Telephone contact. Medical Nutrition Therapy:  Appt start time: 1130 end time:  1145   Assessment:  Primary concerns today: Diabetes Type 2, overweight, Stg 3 CKD Now seeing Calton Dach, FNP.  FBS: 135-150'  Before lunch 170-200's Before dinner 160-180's  Bedtime: 200-300's. Lantus 40 units daily with Novolog 10 units plus sliding scale per meal.  Hyperlipidemia. Can't take cholesterol meds due to side effect per her report. Says she isn't hungry sometimes and skips meals. Discussed problems with that due to skipping insulin and insuffient CHO balance. Will get blood work done soon. She is being treated now for depression and she notes it's helping some. They have had to change some of the meds for better control of her depression. Depression meds may impact blood sugars also.  She notes she eats and sometimes doesn't take her meds  when she is depressed. Sees Dr.Nida, Endocrinology. Barriers are fiancial and transportation. 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   Lab Results  Component Value Date   HGBA1C 7.0 (H) 12/03/2018   CMP Latest Ref Rng & Units 12/03/2018 08/18/2018  Glucose 65 - 99 mg/dL 145(H) 203(H)  BUN 7 - 25 mg/dL 26(H) 27(H)  Creatinine 0.50 - 1.05 mg/dL 1.22(H) 1.44(H)  Sodium 135 - 146 mmol/L 140 141  Potassium 3.5 - 5.3 mmol/L 4.2 4.7  Chloride 98 - 110 mmol/L 104 105  CO2 20 - 32 mmol/L 29 29  Calcium 8.6 - 10.4 mg/dL 9.4 9.6  Total Protein 6.1 - 8.1 g/dL 6.7 6.8  Total Bilirubin 0.2 - 1.2 mg/dL 0.6 0.6  AST 10 - 35 U/L 18 12  ALT 6 - 29 U/L 16 11   Lipid Panel     Component Value Date/Time   CHOL 355 (A) 01/14/2018   TRIG 187 (A) 01/14/2018   HDL 55 01/14/2018   LDLCALC 264 01/14/2018     Preferred Learning Style:   Auditory  Visual  Hands on  Learning Readiness:   Ready  Change in progress   MEDICATIONS: see list   DIETARY INTAKE:  B)Eggs, bacon and L) nothing, water D) cabbage, mac/cheese, chicken,  water Popcorn, water   Usual physical activity: ADL  Estimated energy needs: 1200 calories 135 g carbohydrates 90 g protein 33 g fat  Progress Towards Goal(s):  In progress.   Nutritional Diagnosis:  NB-1.1 Food and nutrition-related knowledge deficit As related to Diabets.  As evidenced by A1C 10.3%.    Intervention: Nutrition and Diabetes education provided on My Plate, CHO counting, meal planning, portion sizes, timing of meals, avoiding snacks between meals unless having a low blood sugar, target ranges for A1C and blood sugars, signs/symptoms and treatment of hyper/hypoglycemia, monitoring blood sugars, taking medications as prescribed, benefits of exercising 30 minutes per day and prevention of complications of DM. Marland KitchenGoals Eat three meals per day and don't skip lunch Take insulin as prescribed. Cut out sweet and processed foods. Avoid salty foods.   Teaching Method Utilized:  Visual Auditory Hands on  Handouts given during visit include:  The Plate Method   Renal Diet  Barriers to learning/adherence to lifestyle change: none  Demonstrated degree of understanding via:  Teach Back   Monitoring/Evaluation:  Dietary intake, exercise, nd body weight in 3 months.(s). She needs on going education and support due to her barriers to better control her DM.

## 2019-04-16 ENCOUNTER — Ambulatory Visit: Payer: Medicaid Other | Admitting: "Endocrinology

## 2019-04-20 ENCOUNTER — Ambulatory Visit: Payer: Medicaid Other | Admitting: "Endocrinology

## 2019-04-22 ENCOUNTER — Encounter: Payer: Self-pay | Admitting: Nutrition

## 2019-05-18 ENCOUNTER — Ambulatory Visit: Payer: Medicaid Other | Admitting: Nutrition

## 2019-05-20 ENCOUNTER — Encounter: Payer: Medicaid Other | Attending: "Endocrinology | Admitting: Nutrition

## 2019-05-20 DIAGNOSIS — IMO0002 Reserved for concepts with insufficient information to code with codable children: Secondary | ICD-10-CM

## 2019-05-20 DIAGNOSIS — N183 Chronic kidney disease, stage 3 unspecified: Secondary | ICD-10-CM

## 2019-05-20 DIAGNOSIS — E1165 Type 2 diabetes mellitus with hyperglycemia: Secondary | ICD-10-CM | POA: Insufficient documentation

## 2019-05-20 DIAGNOSIS — E118 Type 2 diabetes mellitus with unspecified complications: Secondary | ICD-10-CM | POA: Insufficient documentation

## 2019-05-20 LAB — HEMOGLOBIN A1C: Hemoglobin A1C: 7.8

## 2019-05-20 NOTE — Progress Notes (Signed)
Telephone contact. Medical Nutrition Therapy:  Appt start time: 1230 end time: 1245   Assessment:  Primary concerns today: Diabetes Type 2, overweight, Stg 3 CKD Now seeing Calton Dach, FNP.  FBS: 150-300's.  Before lunch 170-200's Before dinner 160-180's  Bedtime: 200-300's. Still skipping meals at times and doesn't get hungry at times. Stlll suffers from depression.  Some days better than others. Just got her Lyrica.  Pain is worse at times.   Has got a Fitbit and has been walking about 2000 steps a day. Lantus 40 units daily with( vial and syringe)  with Humalog 10 units plus sliding scale per meal. Gets help from medication assurance. Hyperlipidemia.  Can't take cholesterol meds due to side effect per her report. Says she isn't hungry sometimes and skips meals. Discussed problems with that due to skipping insulin and insuffient CHO balance. Will get blood work done soon. Sees Dr.Nida, Endocrinology. Barriers are fiancial and transportation. 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   Lab Results  Component Value Date   HGBA1C 7.0 (H) 12/03/2018   CMP Latest Ref Rng & Units 12/03/2018 08/18/2018  Glucose 65 - 99 mg/dL 145(H) 203(H)  BUN 7 - 25 mg/dL 26(H) 27(H)  Creatinine 0.50 - 1.05 mg/dL 1.22(H) 1.44(H)  Sodium 135 - 146 mmol/L 140 141  Potassium 3.5 - 5.3 mmol/L 4.2 4.7  Chloride 98 - 110 mmol/L 104 105  CO2 20 - 32 mmol/L 29 29  Calcium 8.6 - 10.4 mg/dL 9.4 9.6  Total Protein 6.1 - 8.1 g/dL 6.7 6.8  Total Bilirubin 0.2 - 1.2 mg/dL 0.6 0.6  AST 10 - 35 U/L 18 12  ALT 6 - 29 U/L 16 11   Lipid Panel     Component Value Date/Time   CHOL 355 (A) 01/14/2018   TRIG 187 (A) 01/14/2018   HDL 55 01/14/2018   LDLCALC 264 01/14/2018     Preferred Learning Style:   Auditory  Visual  Hands on  Learning Readiness:   Ready  Change in progress   MEDICATIONS: see list   DIETARY INTAKE:  B)Eggs, bacon and L) nothing, water D) cabbage,  mac/cheese, chicken, water Popcorn, water   Usual physical activity: ADL  Estimated energy needs: 1200 calories 135 g carbohydrates 90 g protein 33 g fat  Progress Towards Goal(s):  In progress.   Nutritional Diagnosis:  NB-1.1 Food and nutrition-related knowledge deficit As related to Diabets.  As evidenced by A1C 10.3%.    Intervention: Nutrition and Diabetes education provided on My Plate, CHO counting, meal planning, portion sizes, timing of meals, avoiding snacks between meals unless having a low blood sugar, target ranges for A1C and blood sugars, signs/symptoms and treatment of hyper/hypoglycemia, monitoring blood sugars, taking medications as prescribed, benefits of exercising 30 minutes per day and prevention of complications of DM. Marland Kitchen Goals Eat three meals per day and don't skip lunch Take insulin as prescribed. Cut out sweet and processed foods. Avoid salty foods. Increase fresh fruits and vegetables and high fiber foods.   Teaching Method Utilized:  Visual Auditory Hands on  Handouts given during visit include:  The Plate Method   Renal Diet  Barriers to learning/adherence to lifestyle change: none  Demonstrated degree of understanding via:  Teach Back   Monitoring/Evaluation:  Dietary intake, exercise,and body weight in 3 months.(s). She needs on going education and support due to her barriers to better control her DM.

## 2019-06-23 ENCOUNTER — Encounter: Payer: Self-pay | Admitting: Nutrition

## 2019-06-23 NOTE — Patient Instructions (Signed)
Goals Eat three meals per day and don't skip lunch Take insulin as prescribed. Cut out sweet and processed foods. Avoid salty foods. Increase fresh fruits and vegetables and high fiber foods

## 2019-08-19 ENCOUNTER — Other Ambulatory Visit: Payer: Self-pay | Admitting: "Endocrinology

## 2019-08-19 ENCOUNTER — Other Ambulatory Visit: Payer: Self-pay

## 2019-08-19 ENCOUNTER — Encounter: Payer: Medicaid Other | Attending: "Endocrinology | Admitting: Nutrition

## 2019-08-19 DIAGNOSIS — N183 Chronic kidney disease, stage 3 unspecified: Secondary | ICD-10-CM

## 2019-08-19 DIAGNOSIS — E1165 Type 2 diabetes mellitus with hyperglycemia: Secondary | ICD-10-CM

## 2019-08-19 DIAGNOSIS — IMO0002 Reserved for concepts with insufficient information to code with codable children: Secondary | ICD-10-CM

## 2019-08-19 DIAGNOSIS — E1122 Type 2 diabetes mellitus with diabetic chronic kidney disease: Secondary | ICD-10-CM

## 2019-08-19 DIAGNOSIS — E118 Type 2 diabetes mellitus with unspecified complications: Secondary | ICD-10-CM | POA: Insufficient documentation

## 2019-08-19 DIAGNOSIS — Z794 Long term (current) use of insulin: Secondary | ICD-10-CM

## 2019-08-19 NOTE — Progress Notes (Signed)
r 

## 2019-08-19 NOTE — Progress Notes (Signed)
Telephone contact. Medical Nutrition Therapy:  Appt start time: 1030 end time: 1100   Assessment:  Primary concerns today: Diabetes Type 2, overweight, Stg 3 CKD Now seeing Calton Dach, FNP.  FBS:120-150's.  Before lunch 200's. Before dinner  130-150's  Bedtime: 170's-200's Trying to work on eating better balanced meals. Lantus 30 units.  Humalog 16 units before meals. Isn't using sling  Just started taking Rosuvastatin 20 mg a day. Wellbutriin 1 a day 300 mg. Busbar 5 mg once a day and Cymbalta. Lyrica,   Vit D,  5000 units, 125 mcg once a day. Lo A1C was 7.8% June 2020.   Limited with mobility due to back issues.   Lab Results  Component Value Date   HGBA1C 7.0 (H) 12/03/2018   CMP Latest Ref Rng & Units 12/03/2018 08/18/2018  Glucose 65 - 99 mg/dL 145(H) 203(H)  BUN 7 - 25 mg/dL 26(H) 27(H)  Creatinine 0.50 - 1.05 mg/dL 1.22(H) 1.44(H)  Sodium 135 - 146 mmol/L 140 141  Potassium 3.5 - 5.3 mmol/L 4.2 4.7  Chloride 98 - 110 mmol/L 104 105  CO2 20 - 32 mmol/L 29 29  Calcium 8.6 - 10.4 mg/dL 9.4 9.6  Total Protein 6.1 - 8.1 g/dL 6.7 6.8  Total Bilirubin 0.2 - 1.2 mg/dL 0.6 0.6  AST 10 - 35 U/L 18 12  ALT 6 - 29 U/L 16 11   Lipid Panel     Component Value Date/Time   CHOL 355 (A) 01/14/2018   TRIG 187 (A) 01/14/2018   HDL 55 01/14/2018   LDLCALC 264 01/14/2018     Preferred Learning Style:   Auditory  Visual  Hands on  Learning Readiness:   Ready  Change in progress   MEDICATIONS: see list   DIETARY INTAKE:  B)  1 slice quiche, waffle and apple. L)  Turnip green, mashed potatoes, baked chicken, water D)  Ham sandwich, few chips, fruit cup, water Drinks:  water   Usual physical activity: ADL  Estimated energy needs: 1200 calories 135 g carbohydrates 90 g protein 33 g fat  Progress Towards Goal(s):  In progress.   Nutritional Diagnosis:  NB-1.1 Food and nutrition-related knowledge deficit As related to Diabets.  As evidenced by A1C  10.3%.    Intervention: Nutrition and Diabetes education provided on My Plate, CHO counting, meal planning, portion sizes, timing of meals, avoiding snacks between meals unless having a low blood sugar, target ranges for A1C and blood sugars, signs/symptoms and treatment of hyper/hypoglycemia, monitoring blood sugars, taking medications as prescribed, benefits of exercising 30 minutes per day and prevention of complications of DM. Marland Kitchen Goals Eat three meals per day and don't skip lunch Take insulin as prescribed. Cut out sweet and processed foods. Avoid salty foods. Increase fresh fruits and vegetables and high fiber foods.   Teaching Method Utilized:  Visual Auditory Hands on  Handouts given during visit include:  The Plate Method   Renal Diet  Barriers to learning/adherence to lifestyle change: none  Demonstrated degree of understanding via:  Teach Back   Monitoring/Evaluation:  Dietary intake, exercise,and body weight in 3 months.(s). She needs on going education and support due to her barriers to better control her DM.

## 2019-08-26 ENCOUNTER — Encounter: Payer: Self-pay | Admitting: "Endocrinology

## 2019-08-26 ENCOUNTER — Ambulatory Visit: Payer: Medicaid Other | Admitting: "Endocrinology

## 2019-08-27 ENCOUNTER — Telehealth: Payer: Self-pay | Admitting: "Endocrinology

## 2019-08-27 DIAGNOSIS — E1122 Type 2 diabetes mellitus with diabetic chronic kidney disease: Secondary | ICD-10-CM

## 2019-08-27 NOTE — Telephone Encounter (Signed)
Order entered

## 2019-08-27 NOTE — Telephone Encounter (Signed)
Can you update lab order so I can fax to [facility]?

## 2019-08-29 LAB — COMPREHENSIVE METABOLIC PANEL
AG Ratio: 1.8 (calc) (ref 1.0–2.5)
ALT: 18 U/L (ref 6–29)
AST: 17 U/L (ref 10–35)
Albumin: 4.4 g/dL (ref 3.6–5.1)
Alkaline phosphatase (APISO): 56 U/L (ref 37–153)
BUN/Creatinine Ratio: 18 (calc) (ref 6–22)
BUN: 35 mg/dL — ABNORMAL HIGH (ref 7–25)
CO2: 27 mmol/L (ref 20–32)
Calcium: 9.7 mg/dL (ref 8.6–10.4)
Chloride: 101 mmol/L (ref 98–110)
Creat: 1.9 mg/dL — ABNORMAL HIGH (ref 0.50–1.05)
Globulin: 2.5 g/dL (calc) (ref 1.9–3.7)
Glucose, Bld: 319 mg/dL — ABNORMAL HIGH (ref 65–99)
Potassium: 4.8 mmol/L (ref 3.5–5.3)
Sodium: 137 mmol/L (ref 135–146)
Total Bilirubin: 0.6 mg/dL (ref 0.2–1.2)
Total Protein: 6.9 g/dL (ref 6.1–8.1)

## 2019-08-29 LAB — HEMOGLOBIN A1C
Hgb A1c MFr Bld: 8.8 % of total Hgb — ABNORMAL HIGH (ref <5.7)
Mean Plasma Glucose: 206 (calc)
eAG (mmol/L): 11.4 (calc)

## 2019-09-03 ENCOUNTER — Other Ambulatory Visit: Payer: Self-pay

## 2019-09-03 ENCOUNTER — Encounter: Payer: Self-pay | Admitting: "Endocrinology

## 2019-09-03 ENCOUNTER — Ambulatory Visit (INDEPENDENT_AMBULATORY_CARE_PROVIDER_SITE_OTHER): Payer: Self-pay | Admitting: "Endocrinology

## 2019-09-03 DIAGNOSIS — N183 Chronic kidney disease, stage 3 unspecified: Secondary | ICD-10-CM

## 2019-09-03 DIAGNOSIS — E1122 Type 2 diabetes mellitus with diabetic chronic kidney disease: Secondary | ICD-10-CM

## 2019-09-03 DIAGNOSIS — Z794 Long term (current) use of insulin: Secondary | ICD-10-CM

## 2019-09-03 DIAGNOSIS — E782 Mixed hyperlipidemia: Secondary | ICD-10-CM

## 2019-09-03 DIAGNOSIS — I10 Essential (primary) hypertension: Secondary | ICD-10-CM

## 2019-09-03 NOTE — Progress Notes (Signed)
09/03/2019, 5:28 PM                                                    Endocrinology Telehealth Visit Follow up Note -During COVID -19 Pandemic  This visit type was conducted due to national recommendations for restrictions regarding the COVID-19 Pandemic  in an effort to limit this patient's exposure and mitigate transmission of the corona virus.  Due to her co-morbid illnesses, Ana Wallace is at  moderate to high risk for complications without adequate follow up.  This format is felt to be most appropriate for her at this time.  I connected with this patient on 09/03/2019   by telephone and verified that I am speaking with the correct person using two identifiers. [place], 21-Oct-1961. she has verbally consented to this visit. All issues noted in this document were discussed and addressed. The format was not optimal for physical exam.    Subjective:    Patient ID: Ana Wallace, female    DOB: 06-30-1961.  Ana Wallace is being engaged in telehealth via telephone in follow up  for management of currently uncontrolled symptomatic type 2 diabetes, hyperlipidemia, hypertension requested by  [facility]   Past Medical History:  Diagnosis Date  . Diabetes mellitus, type II (HCC)   . Hyperlipidemia   . Hypertension    Past Surgical History:  Procedure Laterality Date  . ABDOMINAL HYSTERECTOMY    . VAGINAL PROLAPSE REPAIR     Social History   Socioeconomic History  . Marital status: Single    Spouse name: Not on file  . Number of children: Not on file  . Years of education: Not on file  . Highest education level: Not on file  Occupational History  . Not on file  Social Needs  . Financial resource strain: Not on file  . Food insecurity    Worry: Not on file    Inability: Not on file  . Transportation needs    Medical: Not on file    Non-medical: Not on file  Tobacco Use  .  Smoking status: Never Smoker  . Smokeless tobacco: Never Used  Substance and Sexual Activity  . Alcohol use: Never    Frequency: Never  . Drug use: Never  . Sexual activity: Never  Lifestyle  . Physical activity    Days per week: Not on file    Minutes per session: Not on file  . Stress: Not on file  Relationships  . Social Musician on phone: Not on file    Gets together: Not on file    Attends religious service: Not on file    Active member of club or organization: Not on file    Attends meetings of clubs or organizations: Not on file    Relationship status: Not on file  Other Topics Concern  . Not on file  Social History Narrative  . Not on file   Outpatient Encounter Medications as of 09/03/2019  Medication Sig  . Insulin  Glargine (LANTUS) 100 UNIT/ML Solostar Pen Inject 50 Units into the skin at bedtime.  . insulin lispro (HUMALOG) 100 UNIT/ML KwikPen Inject 16-21 Units into the skin 3 (three) times daily before meals.  Marland Kitchen buPROPion (WELLBUTRIN XL) 150 MG 24 hr tablet Take 150 mg by mouth daily.  . busPIRone (BUSPAR) 5 MG tablet Take 5 mg by mouth 2 (two) times daily.  . DULoxetine (CYMBALTA) 60 MG capsule Take 60 mg by mouth daily.  Marland Kitchen losartan (COZAAR) 25 MG tablet Take 25 mg by mouth daily.  . pregabalin (LYRICA) 150 MG capsule Take 150 mg by mouth 2 (two) times daily.  . Vitamin D, Ergocalciferol, (DRISDOL) 50000 units CAPS capsule Take 50,000 Units by mouth every 7 (seven) days.  . [DISCONTINUED] insulin aspart (NOVOLOG FLEXPEN) 100 UNIT/ML FlexPen Inject 10-16 Units into the skin 3 (three) times daily with meals.  . [DISCONTINUED] LANTUS 100 UNIT/ML injection Inject 50 Units into the skin at bedtime.   No facility-administered encounter medications on file as of 09/03/2019.     ALLERGIES: Allergies  Allergen Reactions  . Nickel Other (See Comments)    Rash with burning sensation    VACCINATION STATUS:  There is no immunization history on file for this  patient.  Diabetes She presents for her follow-up diabetic visit. She has type 2 diabetes mellitus. Onset time: She was diagnosed at approximate age of 47 years. Her disease course has been worsening. There are no hypoglycemic associated symptoms. Pertinent negatives for hypoglycemia include no confusion, headaches, pallor or seizures. Associated symptoms include polydipsia and polyuria. Pertinent negatives for diabetes include no chest pain and no polyphagia. There are no hypoglycemic complications. Symptoms are worsening. Diabetic complications include nephropathy. Current diabetic treatment includes insulin injections and oral agent (monotherapy). She is following a generally unhealthy diet. When asked about meal planning, she reported none. She has had a previous visit with a dietitian. She never participates in exercise. Her breakfast blood glucose range is generally 180-200 mg/dl. Her lunch blood glucose range is generally 180-200 mg/dl. Her dinner blood glucose range is generally 180-200 mg/dl. Her bedtime blood glucose range is generally 180-200 mg/dl. Her overall blood glucose range is 180-200 mg/dl. An ACE inhibitor/angiotensin II receptor blocker is being taken.  Hyperlipidemia This is a chronic problem. The current episode started more than 1 year ago. The problem is uncontrolled. Exacerbating diseases include chronic renal disease and diabetes. Pertinent negatives include no chest pain, myalgias or shortness of breath. She is currently on no antihyperlipidemic treatment (She reports side effects from statin medications.). The current treatment provides no improvement of lipids. Risk factors for coronary artery disease include dyslipidemia, diabetes mellitus, a sedentary lifestyle, post-menopausal and obesity.  Hypertension This is a chronic problem. The current episode started more than 1 year ago. The problem is controlled. Pertinent negatives include no chest pain, headaches, palpitations or  shortness of breath. Risk factors for coronary artery disease include dyslipidemia and diabetes mellitus. Past treatments include angiotensin blockers. Identifiable causes of hypertension include chronic renal disease.   Review of systems: Limited as above  Objective:    There were no vitals taken for this visit.  Wt Readings from Last 3 Encounters:  04/15/19 178 lb (80.7 kg)  12/10/18 180 lb (81.6 kg)  10/28/18 174 lb (78.9 kg)     Physical Exam  Constitutional: She is oriented to person, place, and time. She appears well-developed.  HENT:  Head: Normocephalic and atraumatic.  Eyes: EOM are normal.  Neck: Normal range of  motion. Neck supple. No tracheal deviation present. No thyromegaly present.  Cardiovascular: Normal rate.  Pulmonary/Chest: Effort normal.  Abdominal: There is no abdominal tenderness. There is no guarding.  Musculoskeletal: Normal range of motion.        General: No edema.  Neurological: She is alert and oriented to person, place, and time. No cranial nerve deficit. Coordination normal.  Skin: Skin is warm and dry. No rash noted. No erythema. No pallor.  Psychiatric: She has a normal mood and affect. Judgment normal.   Recent Results (from the past 2160 hour(s))  Comprehensive metabolic panel     Status: Abnormal   Collection Time: 08/28/19  9:34 AM  Result Value Ref Range   Glucose, Bld 319 (H) 65 - 99 mg/dL    Comment: .            Fasting reference interval . For someone without known diabetes, a glucose value >125 mg/dL indicates that they may have diabetes and this should be confirmed with a follow-up test. .    BUN 35 (H) 7 - 25 mg/dL   Creat 1.90 (H) 0.50 - 1.05 mg/dL    Comment: For patients >27 years of age, the reference limit for Creatinine is approximately 13% higher for people identified as African-American. .    BUN/Creatinine Ratio 18 6 - 22 (calc)   Sodium 137 135 - 146 mmol/L   Potassium 4.8 3.5 - 5.3 mmol/L   Chloride 101 98 -  110 mmol/L   CO2 27 20 - 32 mmol/L   Calcium 9.7 8.6 - 10.4 mg/dL   Total Protein 6.9 6.1 - 8.1 g/dL   Albumin 4.4 3.6 - 5.1 g/dL   Globulin 2.5 1.9 - 3.7 g/dL (calc)   AG Ratio 1.8 1.0 - 2.5 (calc)   Total Bilirubin 0.6 0.2 - 1.2 mg/dL   Alkaline phosphatase (APISO) 56 37 - 153 U/L   AST 17 10 - 35 U/L   ALT 18 6 - 29 U/L  Hemoglobin A1c     Status: Abnormal   Collection Time: 08/28/19  9:34 AM  Result Value Ref Range   Hgb A1c MFr Bld 8.8 (H) <5.7 % of total Hgb    Comment: For someone without known diabetes, a hemoglobin A1c value of 6.5% or greater indicates that they may have  diabetes and this should be confirmed with a follow-up  test. . For someone with known diabetes, a value <7% indicates  that their diabetes is well controlled and a value  greater than or equal to 7% indicates suboptimal  control. A1c targets should be individualized based on  duration of diabetes, age, comorbid conditions, and  other considerations. . Currently, no consensus exists regarding use of hemoglobin A1c for diagnosis of diabetes for children. .    Mean Plasma Glucose 206 (calc)   eAG (mmol/L) 11.4 (calc)     Assessment & Plan:   1. Type 2 diabetes mellitus with stage 3 chronic kidney disease, with long-term current use of insulin (HCC)  - Ana Wallace has currently uncontrolled symptomatic type 2 DM since 58 years of age. - She reports significantly above target glycemic profile and A1c increased to 8.8% from 7%.     Her labs also show significant improvement in her renal function.   -her diabetes is complicated by stage 2 -3 renal insufficiency (improving) and Ana Wallace remains at a high risk for more acute and chronic complications which include CAD, CVA, CKD, retinopathy, and neuropathy. These are all  discussed in detail with the patient.  - I have counseled her on diet management and weight loss, by adopting a carbohydrate restricted/protein rich diet.  - she  admits  there is a room for improvement in her diet and drink choices. -  Suggestion is made for her to avoid simple carbohydrates  from her diet including Cakes, Sweet Desserts / Pastries, Ice Cream, Soda (diet and regular), Sweet Tea, Candies, Chips, Cookies, Sweet Pastries,  Store Bought Juices, Alcohol in Excess of  1-2 drinks a day, Artificial Sweeteners, Coffee Creamer, and "Sugar-free" Products. This will help patient to have stable blood glucose profile and potentially avoid unintended weight gain.   - I encouraged her to switch to  unprocessed or minimally processed complex starch and increased protein intake (animal or plant source), fruits, and vegetables.  - she is advised to stick to a routine mealtimes to eat 3 meals  a day and avoid unnecessary snacks ( to snack only to correct hypoglycemia).    - I have approached her with the following individualized plan to manage diabetes and patient agrees:   -Based on her reported glycemic profile and A1c of 8.8%, she will need a higher dose of insulin in order for her to achieve and maintain control of diabetes to target.  -I discussed and advised her to increase her Lantus to 50 units nightly, advised to continue Humalog  16-21  units 3 times a day with meals for pre-meal blood glucose readings above 90 mg/dL, associated with strict monitoring of blood glucose 4 times a day- before meals and at bedtime.   -Patient is not a candidate for SGLT2 inhibitors due to CKD.  - Patient specific target  A1c;  LDL, HDL, Triglycerides, and  Waist Circumference were discussed in detail.  2) BP/HTN: she is advised to home monitor blood pressure and report if > 140/90 on 2 separate readings.   She is on azilsartan.  She is advised to be consistent with her blood pressure medications and salt restrictions.  3) Lipids/HPL: She has severe dyslipidemia with LDL 200+.  She reports statin intolerance, advised to avoid better and fried food.  She is a candidate for  Repatha, however she does not have insurance currently.  He has not continued on Niaspan.  She will have repeat fasting lipid panel on subsequent visits.  4)  Weight/Diet: CDE Consult is in progress, exercise, and detailed carbohydrates information provided.  5) Chronic Care/Health Maintenance:  -she  is on ARB and Statin medications and  is encouraged to continue to follow up with Ophthalmology, Dentist,  Podiatrist at least yearly or according to recommendations, and advised to  stay away from smoking. I have recommended yearly flu vaccine and pneumonia vaccination at least every 5 years; moderate intensity exercise for up to 150 minutes weekly; and  sleep for at least 7 hours a day.  - I advised patient to [facility] needs.  - Patient Care Time Today:  25 min, of which >50% was spent in  counseling and the rest reviewing her  current and  previous labs/studies, previous treatments, her blood glucose readings, and medications' doses and developing a plan for long-term care based on the latest recommendations for standards of care.   Ana Wallace participated in the discussions, expressed understanding, and voiced agreement with the above plans.  All questions were answered to her satisfaction. she is encouraged to contact clinic should she have  any questions or concerns prior to her return visit. .  Follow up plan: - Return in about 2 weeks (around 09/17/2019), or phone, for Follow up with [facility] and Logs Only - no Labs.  Marquis LunchGebre Loveah Like, MD [facility] [address] [place], [place] [address] Phone: [phone]  Fax: [phone]    09/03/2019, 5:28 PM  This note was partially dictated with voice recognition software. Similar sounding words can be transcribed inadequately or may not  be corrected upon review.

## 2019-09-22 ENCOUNTER — Other Ambulatory Visit: Payer: Self-pay

## 2019-09-22 ENCOUNTER — Ambulatory Visit (INDEPENDENT_AMBULATORY_CARE_PROVIDER_SITE_OTHER): Payer: Self-pay | Admitting: "Endocrinology

## 2019-09-22 ENCOUNTER — Ambulatory Visit: Payer: Medicaid Other | Admitting: "Endocrinology

## 2019-09-22 ENCOUNTER — Encounter: Payer: Self-pay | Admitting: "Endocrinology

## 2019-09-22 DIAGNOSIS — E782 Mixed hyperlipidemia: Secondary | ICD-10-CM

## 2019-09-22 DIAGNOSIS — I10 Essential (primary) hypertension: Secondary | ICD-10-CM

## 2019-09-22 DIAGNOSIS — E1122 Type 2 diabetes mellitus with diabetic chronic kidney disease: Secondary | ICD-10-CM

## 2019-09-22 DIAGNOSIS — Z794 Long term (current) use of insulin: Secondary | ICD-10-CM

## 2019-09-22 DIAGNOSIS — N183 Chronic kidney disease, stage 3 (moderate): Secondary | ICD-10-CM

## 2019-09-22 MED ORDER — INSULIN LISPRO (1 UNIT DIAL) 100 UNIT/ML (KWIKPEN): 16.0000 [IU] | PEN_INJECTOR | Freq: Three times a day (TID) | SUBCUTANEOUS | 2 refills | Status: DC

## 2019-09-22 MED ORDER — INSULIN GLARGINE 100 UNIT/ML SOLOSTAR PEN: 60.0000 [IU] | PEN_INJECTOR | Freq: Every day | SUBCUTANEOUS | 2 refills | Status: DC

## 2019-09-22 NOTE — Progress Notes (Signed)
09/22/2019, 5:03 PM                                                    Endocrinology Telehealth Visit Follow up Note -During COVID -19 Pandemic  This visit type was conducted due to national recommendations for restrictions regarding the COVID-19 Pandemic  in an effort to limit this patient's exposure and mitigate transmission of the corona virus.  Due to her co-morbid illnesses, Ana Wallace is at  moderate to high risk for complications without adequate follow up.  This format is felt to be most appropriate for her at this time.  I connected with this patient on 09/22/2019   by telephone and verified that I am speaking with the correct person using two identifiers. Ana Wallace, Ana Wallace 14, 1962. she has verbally consented to this visit. All issues noted in this document were discussed and addressed. The format was not optimal for physical exam.    Subjective:    Patient ID: Ana Wallace, female    DOB: 08-Ana Wallace-1962.  Ana Wallace is being engaged in telehealth via telephone in follow up  for management of currently uncontrolled symptomatic type 2 diabetes, hyperlipidemia, hypertension requested by  [facility].   Past Medical History:  Diagnosis Date  . Diabetes mellitus, type II ([place])   . Hyperlipidemia   . Hypertension    Past Surgical History:  Procedure Laterality Date  . ABDOMINAL HYSTERECTOMY    . VAGINAL PROLAPSE REPAIR     Social History   Socioeconomic History  . Marital status: Single    Spouse name: Not on file  . Number of children: Not on file  . Years of education: Not on file  . Highest education level: Not on file  Occupational History  . Not on file  Social Needs  . Financial resource strain: Not on file  . Food insecurity    Worry: Not on file    Inability: Not on file  . Transportation needs    Medical: Not on file    Non-medical: Not on file  Tobacco Use   . Smoking status: Never Smoker  . Smokeless tobacco: Never Used  Substance and Sexual Activity  . Alcohol use: Never    Frequency: Never  . Drug use: Never  . Sexual activity: Never  Lifestyle  . Physical activity    Days per week: Not on file    Minutes per session: Not on file  . Stress: Not on file  Relationships  . Social Herbalist on phone: Not on file    Gets together: Not on file    Attends religious service: Not on file    Active member of club or organization: Not on file    Attends meetings of clubs or organizations: Not on file    Relationship status: Not on file  Other Topics Concern  . Not on file  Social History Narrative  . Not on file   Outpatient Encounter Medications as of 09/22/2019  Medication Sig  . buPROPion (  WELLBUTRIN XL) 150 MG 24 hr tablet Take 150 mg by mouth daily.  . busPIRone (BUSPAR) 5 MG tablet Take 5 mg by mouth 2 (two) times daily.  . DULoxetine (CYMBALTA) 60 MG capsule Take 60 mg by mouth daily.  . Insulin Glargine (LANTUS) 100 UNIT/ML Solostar Pen Inject 60 Units into the skin at bedtime.  . insulin lispro (HUMALOG) 100 UNIT/ML KwikPen Inject 0.16-0.21 mLs (16-21 Units total) into the skin 3 (three) times daily before meals.  Marland Kitchen losartan (COZAAR) 25 MG tablet Take 25 mg by mouth daily.  . pregabalin (LYRICA) 150 MG capsule Take 150 mg by mouth 2 (two) times daily.  . Vitamin D, Ergocalciferol, (DRISDOL) 50000 units CAPS capsule Take 50,000 Units by mouth every 7 (seven) days.  . [DISCONTINUED] Insulin Glargine (LANTUS) 100 UNIT/ML Solostar Pen Inject 60 Units into the skin at bedtime.  . [DISCONTINUED] insulin lispro (HUMALOG) 100 UNIT/ML KwikPen Inject 16-21 Units into the skin 3 (three) times daily before meals.   No facility-administered encounter medications on file as of 09/22/2019.     ALLERGIES: Allergies  Allergen Reactions  . Nickel Other (See Comments)    Rash with burning sensation    VACCINATION STATUS:  There  is no immunization history on file for this patient.  Diabetes She presents for her follow-up diabetic visit. She has type 2 diabetes mellitus. Onset time: She was diagnosed at approximate age of 47 years. Her disease course has been worsening. There are no hypoglycemic associated symptoms. Pertinent negatives for hypoglycemia include no confusion, headaches, pallor or seizures. Associated symptoms include polydipsia and polyuria. Pertinent negatives for diabetes include no chest pain and no polyphagia. There are no hypoglycemic complications. Symptoms are worsening. Diabetic complications include nephropathy. Current diabetic treatment includes insulin injections and oral agent (monotherapy). She is following a generally unhealthy diet. When asked about meal planning, she reported none. She has had a previous visit with a dietitian. She never participates in exercise. Her home blood glucose trend is increasing steadily. Her breakfast blood glucose range is generally 180-200 mg/dl. Her lunch blood glucose range is generally 180-200 mg/dl. Her dinner blood glucose range is generally 180-200 mg/dl. Her bedtime blood glucose range is generally 180-200 mg/dl. Her overall blood glucose range is 180-200 mg/dl. An ACE inhibitor/angiotensin II receptor blocker is being taken.  Hyperlipidemia This is a chronic problem. The current episode started more than 1 year ago. The problem is uncontrolled. Exacerbating diseases include chronic renal disease and diabetes. Pertinent negatives include no chest pain, myalgias or shortness of breath. She is currently on no antihyperlipidemic treatment (She reports side effects from statin medications.). The current treatment provides no improvement of lipids. Risk factors for coronary artery disease include dyslipidemia, diabetes mellitus, a sedentary lifestyle, post-menopausal and obesity.  Hypertension This is a chronic problem. The current episode started more than 1 year ago.  The problem is controlled. Pertinent negatives include no chest pain, headaches, palpitations or shortness of breath. Risk factors for coronary artery disease include dyslipidemia and diabetes mellitus. Past treatments include angiotensin blockers. Identifiable causes of hypertension include chronic renal disease.   Review of systems: Limited as above  Objective:    There were no vitals taken for this visit.  Wt Readings from Last 3 Encounters:  04/15/19 178 lb (80.7 kg)  12/10/18 180 lb (81.6 kg)  10/28/18 174 lb (78.9 kg)     Recent Results (from the past 2160 hour(s))  Comprehensive metabolic panel     Status: Abnormal  Collection Time: 08/28/19  9:34 AM  Result Value Ref Range   Glucose, Bld 319 (H) 65 - 99 mg/dL    Comment: .            Fasting reference interval . For someone without known diabetes, a glucose value >125 mg/dL indicates that they may have diabetes and this should be confirmed with a follow-up test. .    BUN 35 (H) 7 - 25 mg/dL   Creat 5.05 (H) 6.97 - 1.05 mg/dL    Comment: For patients >66 years of age, the reference limit for Creatinine is approximately 13% higher for people identified as African-American. .    BUN/Creatinine Ratio 18 6 - 22 (calc)   Sodium 137 135 - 146 mmol/L   Potassium 4.8 3.5 - 5.3 mmol/L   Chloride 101 98 - 110 mmol/L   CO2 27 20 - 32 mmol/L   Calcium 9.7 8.6 - 10.4 mg/dL   Total Protein 6.9 6.1 - 8.1 g/dL   Albumin 4.4 3.6 - 5.1 g/dL   Globulin 2.5 1.9 - 3.7 g/dL (calc)   AG Ratio 1.8 1.0 - 2.5 (calc)   Total Bilirubin 0.6 0.2 - 1.2 mg/dL   Alkaline phosphatase (APISO) 56 37 - 153 U/L   AST 17 10 - 35 U/L   ALT 18 6 - 29 U/L  Hemoglobin A1c     Status: Abnormal   Collection Time: 08/28/19  9:34 AM  Result Value Ref Range   Hgb A1c MFr Bld 8.8 (H) <5.7 % of total Hgb    Comment: For someone without known diabetes, a hemoglobin A1c value of 6.5% or greater indicates that they may have  diabetes and this should be  confirmed with a follow-up  test. . For someone with known diabetes, a value <7% indicates  that their diabetes is well controlled and a value  greater than or equal to 7% indicates suboptimal  control. A1c targets should be individualized based on  duration of diabetes, age, comorbid conditions, and  other considerations. . Currently, no consensus exists regarding use of hemoglobin A1c for diagnosis of diabetes for children. .    Mean Plasma Glucose 206 (calc)   eAG (mmol/L) 11.4 (calc)     Assessment & Plan:   1. Type 2 diabetes mellitus with stage 3 chronic kidney disease, with long-term current use of insulin (HCC)  - Ana Wallace has currently uncontrolled symptomatic type 2 DM since 58 years of age. - She reports significantly above target glycemic profile including fasting between 142-440, prelunch between 161-306, presupper between 148-289.  Her recent A1c was high at 8.8% increasing from 7%.      Her labs also show significant improvement in her renal function.   -her diabetes is complicated by stage 2 -3 renal insufficiency (improving) and Ana Wallace remains at a high risk for more acute and chronic complications which include CAD, CVA, CKD, retinopathy, and neuropathy. These are all discussed in detail with the patient.  - I have counseled her on diet management and weight loss, by adopting a carbohydrate restricted/protein rich diet.  - - she  admits there is a room for improvement in her diet and drink choices. -  Suggestion is made for her to avoid simple carbohydrates  from her diet including Cakes, Sweet Desserts / Pastries, Ice Cream, Soda (diet and regular), Sweet Tea, Candies, Chips, Cookies, Sweet Pastries,  Store Bought Juices, Alcohol in Excess of  1-2 drinks a day, Artificial Sweeteners, Coffee Creamer,  and "Sugar-free" Products. This will help patient to have stable blood glucose profile and potentially avoid unintended weight gain.    - I encouraged  her to switch to  unprocessed or minimally processed complex starch and increased protein intake (animal or plant source), fruits, and vegetables.  - she is advised to stick to a routine mealtimes to eat 3 meals  a day and avoid unnecessary snacks ( to snack only to correct hypoglycemia).    - I have approached her with the following individualized plan to manage diabetes and patient agrees:   -Based on her reported glycemic profile and A1c of 8.8%, she will continue to need intensive treatment with higher dose of basal/bolus insulin.     -She is advised to increase her Lantus to 60 units nightly,  advised to continue Humalog  16-21  units 3 times a day with meals for pre-meal blood glucose readings above 90 mg/dL, associated with strict monitoring of blood glucose 4 times a day- before meals and at bedtime.   -Patient is not a candidate for SGLT2 inhibitors due to CKD.  - Patient specific target  A1c;  LDL, HDL, Triglycerides, and  Waist Circumference were discussed in detail.  2) BP/HTN: she is advised to home monitor blood pressure and report if > 140/90 on 2 separate readings.   She is on azilsartan.  She is advised to be consistent with her blood pressure medications and salt restrictions.  3) Lipids/HPL: She has severe dyslipidemia with LDL 200+.  She reports statin intolerance, advised to avoid better and fried food.  She is a candidate for Repatha, however she does not have insurance currently.  She is advised to continue Niaspan 500 mg p.o. daily at bedtime.    She will have repeat fasting lipid panel on subsequent visits.  4)  Weight/Diet: CDE Consult is in progress, exercise, and detailed carbohydrates information provided.  5) Chronic Care/Health Maintenance:  -she  is on ARB and Statin medications and  is encouraged to continue to follow up with Ophthalmology, Dentist,  Podiatrist at least yearly or according to recommendations, and advised to  stay away from smoking. I have  recommended yearly flu vaccine and pneumonia vaccination at least every 5 years; moderate intensity exercise for up to 150 minutes weekly; and  sleep for at least 7 hours a day.  - I advised patient to [facility] needs.  - Patient Care Time Today:  25 min, of which >50% was spent in  counseling and the rest reviewing her  current and  previous labs/studies, previous treatments, her blood glucose readings, and medications' doses and developing a plan for long-term care based on the latest recommendations for standards of care.   Lindalou HoseShelby Deyton participated in the discussions, expressed understanding, and voiced agreement with the above plans.  All questions were answered to her satisfaction. she is encouraged to contact clinic should she have any questions or concerns prior to her return visit.  .  Follow up plan: - Return in 3 months (on 12/22/2019) for Bring Meter and Logs- A1c in Office, Include 8 log sheets.  Marquis LunchGebre Belita Warsame, MD [facility] [address] [place], [place] [address] Phone: [phone]  Fax: [phone]    09/22/2019, 5:03 PM  This note was partially dictated with voice recognition software. Similar sounding words can be transcribed inadequately or may not  be corrected upon review.

## 2019-09-28 ENCOUNTER — Encounter: Payer: Self-pay | Admitting: Nutrition

## 2019-09-28 NOTE — Patient Instructions (Signed)
  Goals Eat three meals per day and don't skip lunch Take insulin as prescribed. Cut out sweet and processed foods. Avoid salty foods. Increase fresh fruits and vegetables and high fiber foods.

## 2019-11-19 ENCOUNTER — Encounter: Payer: Medicaid Other | Attending: Nurse Practitioner | Admitting: Nutrition

## 2019-11-19 ENCOUNTER — Other Ambulatory Visit: Payer: Self-pay

## 2019-11-19 ENCOUNTER — Encounter: Payer: Self-pay | Admitting: Nutrition

## 2019-11-19 VITALS — Ht 61.0 in | Wt 184.0 lb

## 2019-11-19 DIAGNOSIS — I10 Essential (primary) hypertension: Secondary | ICD-10-CM

## 2019-11-19 DIAGNOSIS — IMO0002 Reserved for concepts with insufficient information to code with codable children: Secondary | ICD-10-CM

## 2019-11-19 DIAGNOSIS — E1165 Type 2 diabetes mellitus with hyperglycemia: Secondary | ICD-10-CM

## 2019-11-19 DIAGNOSIS — E66813 Obesity, class 3: Secondary | ICD-10-CM

## 2019-11-19 DIAGNOSIS — E782 Mixed hyperlipidemia: Secondary | ICD-10-CM

## 2019-11-19 DIAGNOSIS — E118 Type 2 diabetes mellitus with unspecified complications: Secondary | ICD-10-CM | POA: Insufficient documentation

## 2019-11-19 DIAGNOSIS — N181 Chronic kidney disease, stage 1: Secondary | ICD-10-CM

## 2019-11-19 NOTE — Patient Instructions (Signed)
Goals Eat three meals per day and don't skip lunch Take insulin as prescribed. Cut out sweet and processed foods. Avoid salty foods. Increase fresh fruits and vegetables and high fiber foods.

## 2019-11-19 NOTE — Progress Notes (Signed)
Telephone contact. Medical Nutrition Therapy:  Appt start time: 1030 end time: 1100   Assessment:  Primary concerns today: Diabetes Type 2, overweight, Stg 3 CKD. Lantus 60 units 16 units of Humalog with meals and sliding scale. Gained 6 lbs. Admits to eating more than she should at times. Not execicsing. Creatine elevated at 1.9 mg/dl. Now seeing Calton Dach, FNP.  No low blood sugars. FBS:97-120's0's.  Before lunch [phone],. Before dinner  87 bedtime   Bedtime: 170's-200's Rosuvastatin 20 mg a day. Wellbutriin 1 a day 300 mg. Busbar 5 mg once a day and Cymbalta. Lyrica,   Vit D,  5000 units, 125 mcg once a day.  Her depression is much better. Diet needs more lower carb vegetables. Sees Dr. Dorris Fetch in December 2020.  Lab Results  Component Value Date   HGBA1C 8.8 (H) 08/28/2019   CMP Latest Ref Rng & Units 08/28/2019 12/03/2018 08/18/2018  Glucose 65 - 99 mg/dL 319(H) 145(H) 203(H)  BUN 7 - 25 mg/dL 35(H) 26(H) 27(H)  Creatinine 0.50 - 1.05 mg/dL 1.90(H) 1.22(H) 1.44(H)  Sodium 135 - 146 mmol/L 137 140 141  Potassium 3.5 - 5.3 mmol/L 4.8 4.2 4.7  Chloride 98 - 110 mmol/L 101 104 105  CO2 20 - 32 mmol/L 27 29 29   Calcium 8.6 - 10.4 mg/dL 9.7 9.4 9.6  Total Protein 6.1 - 8.1 g/dL 6.9 6.7 6.8  Total Bilirubin 0.2 - 1.2 mg/dL 0.6 0.6 0.6  AST 10 - 35 U/L 17 18 12   ALT 6 - 29 U/L 18 16 11    Lipid Panel     Component Value Date/Time   CHOL 355 (A) 01/14/2018   TRIG 187 (A) 01/14/2018   HDL 55 01/14/2018   LDLCALC 264 01/14/2018     Preferred Learning Style:   Auditory  Visual  Hands on  Learning Readiness:   Ready  Change in progress   MEDICATIONS: see list   DIETARY INTAKE:  B) omelet and coffee, 1 slice toast apple sauce, L)  Sandwich, fruit, water D)  Sandwich ham, chew chips water, yogurt Drinks:  water   Usual physical activity: ADL  Estimated energy needs: 1200 calories 135 g carbohydrates 90 g protein 33 g fat  Progress Towards Goal(s):   In progress.   Nutritional Diagnosis:  NB-1.1 Food and nutrition-related knowledge deficit As related to Diabets.  As evidenced by A1C 10.3%.    Intervention: Nutrition and Diabetes education provided on My Plate, CHO counting, meal planning, portion sizes, timing of meals, avoiding snacks between meals unless having a low blood sugar, target ranges for A1C and blood sugars, signs/symptoms and treatment of hyper/hypoglycemia, monitoring blood sugars, taking medications as prescribed, benefits of exercising 30 minutes per day and prevention of complications of DM. Marland Kitchen Goals Eat three meals per day and don't skip lunch Take insulin as prescribed. Cut out sweet and processed foods. Avoid salty foods. Increase fresh fruits and vegetables and high fiber foods.   Teaching Method Utilized:  Visual Auditory Hands on  Handouts given during visit include:  The Plate Method   Renal Diet  Barriers to learning/adherence to lifestyle change: none  Demonstrated degree of understanding via:  Teach Back   Monitoring/Evaluation:  Dietary intake, exercise,and body weight in 3 months.(s). She needs on going education and support due to her barriers to better control her DM.

## 2019-12-23 ENCOUNTER — Encounter: Payer: Self-pay | Admitting: "Endocrinology

## 2019-12-23 ENCOUNTER — Other Ambulatory Visit: Payer: Self-pay

## 2019-12-23 ENCOUNTER — Ambulatory Visit (INDEPENDENT_AMBULATORY_CARE_PROVIDER_SITE_OTHER): Payer: Self-pay | Admitting: "Endocrinology

## 2019-12-23 VITALS — BP 134/71 | HR 82 | Wt 184.6 lb

## 2019-12-23 DIAGNOSIS — E1121 Type 2 diabetes mellitus with diabetic nephropathy: Secondary | ICD-10-CM

## 2019-12-23 DIAGNOSIS — N1831 Chronic kidney disease, stage 3a: Secondary | ICD-10-CM

## 2019-12-23 DIAGNOSIS — E782 Mixed hyperlipidemia: Secondary | ICD-10-CM

## 2019-12-23 DIAGNOSIS — Z794 Long term (current) use of insulin: Secondary | ICD-10-CM

## 2019-12-23 DIAGNOSIS — I10 Essential (primary) hypertension: Secondary | ICD-10-CM

## 2019-12-23 LAB — POCT GLYCOSYLATED HEMOGLOBIN (HGB A1C): Hemoglobin A1C: 6.9 % — AB (ref 4.0–5.6)

## 2019-12-23 MED ORDER — LOSARTAN POTASSIUM 25 MG PO TABS: 25.0000 mg | ORAL_TABLET | Freq: Every day | ORAL | 1 refills | Status: DC

## 2019-12-23 MED ORDER — INSULIN LISPRO (1 UNIT DIAL) 100 UNIT/ML (KWIKPEN): 14.0000 [IU] | PEN_INJECTOR | Freq: Three times a day (TID) | SUBCUTANEOUS | 2 refills | Status: DC

## 2019-12-23 NOTE — Patient Instructions (Signed)

## 2019-12-23 NOTE — Progress Notes (Signed)
12/23/2019, 5:56 PM        Endocrinology follow-up note   Subjective:    Patient ID: Ana Wallace, female    DOB: [birth year].  Ana HoseShelby Capps is being seen in follow up  for management of currently uncontrolled symptomatic type 2 diabetes, hyperlipidemia, hypertension requested by  [facility]   Past Medical History:  Diagnosis Date  . Diabetes mellitus, type II (HCC)   . Hyperlipidemia   . Hypertension    Past Surgical History:  Procedure Laterality Date  . ABDOMINAL HYSTERECTOMY    . VAGINAL PROLAPSE REPAIR     Social History   Socioeconomic History  . Marital status: Single    Spouse name: Not on file  . Number of children: Not on file  . Years of education: Not on file  . Highest education level: Not on file  Occupational History  . Not on file  Tobacco Use  . Smoking status: Never Smoker  . Smokeless tobacco: Never Used  Substance and Sexual Activity  . Alcohol use: Never  . Drug use: Never  . Sexual activity: Never  Other Topics Concern  . Not on file  Social History Narrative  . Not on file   Social Determinants of Health   Financial Resource Strain:   . Difficulty of Paying Living Expenses: Not on file  Food Insecurity:   . Worried About Programme researcher, broadcasting/film/videounning Out of Food in the Last Year: Not on file  . Ran Out of Food in the Last Year: Not on file  Transportation Needs:   . Lack of Transportation (Medical): Not on file  . Lack of Transportation (Non-Medical): Not on file  Physical Activity:   . Days of Exercise per Week: Not on file  . Minutes of Exercise per Session: Not on file  Stress:   . Feeling of Stress : Not on file  Social Connections:   . Frequency of Communication with Friends and Family: Not on file  . Frequency of Social Gatherings with Friends and Family: Not on file  . Attends Religious Services: Not on file  . Active Member of Clubs or  Organizations: Not on file  . Attends BankerClub or Organization Meetings: Not on file  . Marital Status: Not on file   Outpatient Encounter Medications as of 12/23/2019  Medication Sig  . buPROPion (WELLBUTRIN XL) 150 MG 24 hr tablet Take 150 mg by mouth daily.  . busPIRone (BUSPAR) 5 MG tablet Take 5 mg by mouth 2 (two) times daily.  . DULoxetine (CYMBALTA) 60 MG capsule Take 60 mg by mouth daily.  . Insulin Glargine (LANTUS) 100 UNIT/ML Solostar Pen Inject 60 Units into the skin at bedtime.  . insulin lispro (HUMALOG) 100 UNIT/ML KwikPen Inject 0.14-0.2 mLs (14-20 Units total) into the skin 3 (three) times daily before meals.  Marland Kitchen. losartan (COZAAR) 25 MG tablet Take 1 tablet (25 mg total) by mouth daily.  . niacin (NIASPAN) 500 MG CR tablet Take 500 mg by mouth at bedtime.  . pregabalin (LYRICA) 150 MG capsule Take 150 mg by mouth 2 (two) times daily.  . rosuvastatin (CRESTOR) 20 MG tablet Take 20 mg by mouth daily.  . Vitamin  D, Ergocalciferol, (DRISDOL) 50000 units CAPS capsule Take 50,000 Units by mouth every 7 (seven) days.  . [DISCONTINUED] insulin lispro (HUMALOG) 100 UNIT/ML KwikPen Inject 0.16-0.21 mLs (16-21 Units total) into the skin 3 (three) times daily before meals.  . [DISCONTINUED] losartan (COZAAR) 25 MG tablet Take 25 mg by mouth daily.   No facility-administered encounter medications on file as of 12/23/2019.    ALLERGIES: Allergies  Allergen Reactions  . Nickel Other (See Comments)    Rash with burning sensation    VACCINATION STATUS:  There is no immunization history on file for this patient.  Diabetes She presents for her follow-up diabetic visit. She has type 2 diabetes mellitus. Onset time: She was diagnosed at approximate age of 58 years. Her disease course has been improving. There are no hypoglycemic associated symptoms. Pertinent negatives for hypoglycemia include no confusion, headaches, pallor or seizures. Associated symptoms include polydipsia and polyuria.  Pertinent negatives for diabetes include no chest pain and no polyphagia. There are no hypoglycemic complications. Symptoms are improving. Diabetic complications include nephropathy. Current diabetic treatment includes insulin injections and oral agent (monotherapy). She is following a generally unhealthy diet. When asked about meal planning, she reported none. She has had a previous visit with a dietitian. She never participates in exercise. Her home blood glucose trend is increasing steadily. Her breakfast blood glucose range is generally 140-180 mg/dl. Her lunch blood glucose range is generally 140-180 mg/dl. Her dinner blood glucose range is generally 140-180 mg/dl. Her bedtime blood glucose range is generally 180-200 mg/dl. Her overall blood glucose range is 140-180 mg/dl. An ACE inhibitor/angiotensin II receptor blocker is being taken.  Hyperlipidemia This is a chronic problem. The current episode started more than 1 year ago. The problem is uncontrolled. Exacerbating diseases include chronic renal disease and diabetes. Pertinent negatives include no chest pain, myalgias or shortness of breath. She is currently on no antihyperlipidemic treatment (She reports side effects from statin medications.). The current treatment provides no improvement of lipids. Risk factors for coronary artery disease include dyslipidemia, diabetes mellitus, a sedentary lifestyle, post-menopausal and obesity.  Hypertension This is a chronic problem. The current episode started more than 1 year ago. The problem is controlled. Pertinent negatives include no chest pain, headaches, palpitations or shortness of breath. Risk factors for coronary artery disease include dyslipidemia and diabetes mellitus. Past treatments include angiotensin blockers. Identifiable causes of hypertension include chronic renal disease.   Review of systems: Limited as above  Objective:    BP 134/71   Pulse 82   Wt 184 lb 9.6 oz (83.7 kg)   BMI 34.88  kg/m   Wt Readings from Last 3 Encounters:  12/23/19 184 lb 9.6 oz (83.7 kg)  11/19/19 184 lb (83.5 kg)  04/15/19 178 lb (80.7 kg)     Recent Results (from the past 2160 hour(s))  HgB A1c     Status: Abnormal   Collection Time: 12/23/19  4:20 PM  Result Value Ref Range   Hemoglobin A1C 6.9 (A) 4.0 - 5.6 %   HbA1c POC (<> result, manual entry)     HbA1c, POC (prediabetic range)     HbA1c, POC (controlled diabetic range)       Assessment & Plan:   1. Type 2 diabetes mellitus with stage 3 chronic kidney disease, with long-term current use of insulin (HCC)  - Jasmyn Picha has currently uncontrolled symptomatic type 2 DM since 58 years of age. - She presents with controlled glycemic profile, and logs will be  scanned into her record.  Her point-of-care A1c is 6.9% improving from present last visit .    Her labs also show significant improvement in her renal function.   -her diabetes is complicated by stage 2 -3 renal insufficiency (improving) and Cristyn Crossno remains at a high risk for more acute and chronic complications which include CAD, CVA, CKD, retinopathy, and neuropathy. These are all discussed in detail with the patient.  - I have counseled her on diet management and weight loss, by adopting a carbohydrate restricted/protein rich diet.  - she  admits there is a room for improvement in her diet and drink choices. -  Suggestion is made for her to avoid simple carbohydrates  from her diet including Cakes, Sweet Desserts / Pastries, Ice Cream, Soda (diet and regular), Sweet Tea, Candies, Chips, Cookies, Sweet Pastries,  Store Bought Juices, Alcohol in Excess of  1-2 drinks a day, Artificial Sweeteners, Coffee Creamer, and "Sugar-free" Products. This will help patient to have stable blood glucose profile and potentially avoid unintended weight gain.   - I encouraged her to switch to  unprocessed or minimally processed complex starch and increased protein intake (animal or plant  source), fruits, and vegetables.  - she is advised to stick to a routine mealtimes to eat 3 meals  a day and avoid unnecessary snacks ( to snack only to correct hypoglycemia).    - I have approached her with the following individualized plan to manage diabetes and patient agrees:   -She will continue to need intensive treatment with basal/bolus insulin in order for her to maintain control of diabetes to target.    -She is advised to continue Lantus 60 units nightly, advised to lower Humalog to 14-20  units 3 times a day with meals for pre-meal blood glucose readings above 90 mg/dL, associated with strict monitoring of blood glucose 4 times a day- before meals and at bedtime.   -Patient is not a candidate for SGLT2 inhibitors due to CKD.  - Patient specific target  A1c;  LDL, HDL, Triglycerides, and  Waist Circumference were discussed in detail.  2) BP/HTN: Her blood pressure is controlled to target.   She is on azilsartan.  She is advised to be consistent with her blood pressure medications and salt restrictions.  I will refill her losartan.  3) Lipids/HPL: She has severe dyslipidemia with LDL 200+.  She reports statin intolerance, advised to avoid better and fried food.  She is a candidate for Repatha, however she does not have insurance currently.  She is advised to continue Niaspan 500 mg p.o. daily at bedtime.    She will have repeat fasting lipid panel on subsequent visits.  4)  Weight/Diet: CDE Consult is in progress, exercise, and detailed carbohydrates information provided.  5) Chronic Care/Health Maintenance:  -she  is on ARB and Statin medications and  is encouraged to continue to follow up with Ophthalmology, Dentist,  Podiatrist at least yearly or according to recommendations, and advised to  stay away from smoking. I have recommended yearly flu vaccine and pneumonia vaccination at least every 5 years; moderate intensity exercise for up to 150 minutes weekly; and  sleep for at  least 7 hours a day.  - I advised patient to maintain close follow up with [facility] for primary care needs.  - Time spent with the patient: 25 min, of which >50% was spent in reviewing her blood glucose logs , discussing her hypoglycemia and hyperglycemia episodes, reviewing  her current and  previous labs / studies and medications  doses and developing a plan to avoid hypoglycemia and hyperglycemia. Please refer to Patient Instructions for Blood Glucose Monitoring and Insulin/Medications Dosing Guide"  in media tab for additional information. Please  also refer to " Patient Self Inventory" in the Media  tab for reviewed elements of pertinent patient history.  Ana Hose participated in the discussions, expressed understanding, and voiced agreement with the above plans.  All questions were answered to her satisfaction. she is encouraged to contact clinic should she have any questions or concerns prior to her return visit.   .  Follow up plan: - Return in about 4 months (around 04/22/2020) for Follow up with Pre-visit Labs, Next Visit A1c in Office.  Marquis Lunch, MD [facility] [address] [place], [place] [address] Phone: [phone]  Fax: [phone]    12/23/2019, 5:56 PM  This note was partially dictated with voice recognition software. Similar sounding words can be transcribed inadequately or may not  be corrected upon review.

## 2020-01-19 ENCOUNTER — Telehealth: Payer: Self-pay | Admitting: Nutrition

## 2020-01-19 NOTE — Telephone Encounter (Signed)
PT. Called inquiring about the [place] Diet. I agreed that was a healthy meal plan but still have to stick to 30-40 grams of carbs per meal. She verbalized understanding. Taking insulin as prescribed. Complains of not being hungry at lunch. Advised to be sure to eat lunch so she can take her insulin. She verbalized understanding.

## 2020-03-24 ENCOUNTER — Encounter: Payer: Self-pay | Admitting: Nutrition

## 2020-03-24 ENCOUNTER — Other Ambulatory Visit: Payer: Self-pay

## 2020-03-24 ENCOUNTER — Encounter: Payer: Medicaid Other | Attending: "Endocrinology | Admitting: Nutrition

## 2020-03-24 VITALS — Ht 61.0 in | Wt 190.0 lb

## 2020-03-24 DIAGNOSIS — I10 Essential (primary) hypertension: Secondary | ICD-10-CM | POA: Insufficient documentation

## 2020-03-24 DIAGNOSIS — E782 Mixed hyperlipidemia: Secondary | ICD-10-CM | POA: Insufficient documentation

## 2020-03-24 DIAGNOSIS — N181 Chronic kidney disease, stage 1: Secondary | ICD-10-CM

## 2020-03-24 DIAGNOSIS — E1165 Type 2 diabetes mellitus with hyperglycemia: Secondary | ICD-10-CM

## 2020-03-24 DIAGNOSIS — E118 Type 2 diabetes mellitus with unspecified complications: Secondary | ICD-10-CM | POA: Insufficient documentation

## 2020-03-24 DIAGNOSIS — IMO0002 Reserved for concepts with insufficient information to code with codable children: Secondary | ICD-10-CM

## 2020-03-24 NOTE — Patient Instructions (Signed)
Goals  Eat lunch daily. Take insulin with meals. Drink only water Increase vegetables.

## 2020-03-24 NOTE — Progress Notes (Signed)
Telephone contact. Medical Nutrition Therapy:  Appt start time: 5631 end time: 4970   Assessment:  Primary concerns today: Diabetes Type 2, overweight, Stg 3 CKD. Lantus 60 units 14 units of Humalog with meals and sliding scale. Complains a lot of back pain and not being able to exercise due to that.  Gained another 6 lbs.   Admits to eating more than she should at times. Not execicsing.  Creatine elevated at 1.9 mg/dl. Sees CKD with [place] [facility]   Now seeing Calton Dach, [place]. Sees her April 7th.   FBS: 80-90's's.  Before lunch 120's,. Before dinner  150-160's bedtime   Bedtime: 160-170's Rosuvastatin 20 mg a day. Wellbutriin 1 a day 300 mg. Busbar 5 mg once a day and Cymbalta. Lyrica,   Vit D,  5000 units, 125 mcg once a day. Her depression is little better. She is seeing a therapist. She lost some family members over the last few months.  . Diet needs more lower carb vegetables and exercise. But is limited to her back problems right now.. Sees Dr. Dorris Fetch next month. .  Lab Results  Component Value Date   HGBA1C 6.9 (A) 12/23/2019   CMP Latest Ref Rng & Units 08/28/2019 12/03/2018 08/18/2018  Glucose 65 - 99 mg/dL 319(H) 145(H) 203(H)  BUN 7 - 25 mg/dL 35(H) 26(H) 27(H)  Creatinine 0.50 - 1.05 mg/dL 1.90(H) 1.22(H) 1.44(H)  Sodium 135 - 146 mmol/L 137 140 141  Potassium 3.5 - 5.3 mmol/L 4.8 4.2 4.7  Chloride 98 - 110 mmol/L 101 104 105  CO2 20 - 32 mmol/L 27 29 29   Calcium 8.6 - 10.4 mg/dL 9.7 9.4 9.6  Total Protein 6.1 - 8.1 g/dL 6.9 6.7 6.8  Total Bilirubin 0.2 - 1.2 mg/dL 0.6 0.6 0.6  AST 10 - 35 U/L 17 18 12   ALT 6 - 29 U/L 18 16 11    Lipid Panel     Component Value Date/Time   CHOL 355 (A) 01/14/2018 0000   TRIG 187 (A) 01/14/2018 0000   HDL 55 01/14/2018 0000   LDLCALC 264 01/14/2018 0000     Preferred Learning Style:   Auditory  Visual  Hands on  Learning Readiness:   Ready  Change in progress   MEDICATIONS: see list    DIETARY INTAKE:  B) Oatmeal and egg L)  Skipped didn't feelin.  D)  Sandwich ham, chew chips water, yogurt Drinks:  water   Usual physical activity: ADL  Estimated energy needs: 1200 calories 135 g carbohydrates 90 g protein 33 g fat  Progress Towards Goal(s):  In progress.   Nutritional Diagnosis:  NB-1.1 Food and nutrition-related knowledge deficit As related to Diabets.  As evidenced by A1C 10.3%.    Intervention: Nutrition and Diabetes education provided on My Plate, CHO counting, meal planning, portion sizes, timing of meals, avoiding snacks between meals unless having a low blood sugar, target ranges for A1C and blood sugars, signs/symptoms and treatment of hyper/hypoglycemia, monitoring blood sugars, taking medications as prescribed, benefits of exercising 30 minutes per day and prevention of complications of DM. Marland Kitchen Goals Eat three meals per day and don't skip lunch Take insulin as prescribed. Cut out sweet and processed foods. Avoid salty foods. Increase fresh fruits and vegetables and high fiber foods.   Teaching Method Utilized:  Visual Auditory Hands on  Handouts given during visit include:  The Plate Method   Renal Diet  Barriers to learning/adherence to lifestyle change: none  Demonstrated degree of  understanding via:  Teach Back   Monitoring/Evaluation:  Dietary intake, exercise,and body weight in 3 months.(s). She needs on going education and support due to her barriers to better control her DM.

## 2020-04-26 ENCOUNTER — Ambulatory Visit: Payer: Medicaid Other | Admitting: "Endocrinology

## 2020-04-26 ENCOUNTER — Ambulatory Visit: Payer: Medicaid Other | Admitting: Nutrition

## 2020-06-29 ENCOUNTER — Other Ambulatory Visit: Payer: Self-pay | Admitting: "Endocrinology

## 2020-07-04 ENCOUNTER — Other Ambulatory Visit: Payer: Self-pay | Admitting: "Endocrinology

## 2020-07-26 LAB — COMPLETE METABOLIC PANEL WITH GFR
AG Ratio: 1.8 (calc) (ref 1.0–2.5)
ALT: 26 U/L (ref 6–29)
AST: 24 U/L (ref 10–35)
Albumin: 4.3 g/dL (ref 3.6–5.1)
Alkaline phosphatase (APISO): 47 U/L (ref 37–153)
BUN/Creatinine Ratio: 14 (calc) (ref 6–22)
BUN: 23 mg/dL (ref 7–25)
CO2: 29 mmol/L (ref 20–32)
Calcium: 9.3 mg/dL (ref 8.6–10.4)
Chloride: 106 mmol/L (ref 98–110)
Creat: 1.62 mg/dL — ABNORMAL HIGH (ref 0.50–1.05)
GFR, Est African American: 40 mL/min/{1.73_m2} — ABNORMAL LOW (ref >=60)
GFR, Est Non African American: 35 mL/min/{1.73_m2} — ABNORMAL LOW (ref >=60)
Globulin: 2.4 g/dL (calc) (ref 1.9–3.7)
Glucose, Bld: 108 mg/dL — ABNORMAL HIGH (ref 65–99)
Potassium: 4.8 mmol/L (ref 3.5–5.3)
Sodium: 143 mmol/L (ref 135–146)
Total Bilirubin: 0.7 mg/dL (ref 0.2–1.2)
Total Protein: 6.7 g/dL (ref 6.1–8.1)

## 2020-07-26 LAB — LIPID PANEL
Cholesterol: 185 mg/dL (ref <200)
HDL: 59 mg/dL (ref >=50)
LDL Cholesterol (Calc): 109 mg/dL (calc) — ABNORMAL HIGH
Non-HDL Cholesterol (Calc): 126 mg/dL (calc) (ref <130)
Total CHOL/HDL Ratio: 3.1 (calc) (ref <5.0)
Triglycerides: 82 mg/dL (ref <150)

## 2020-07-26 LAB — MICROALBUMIN / CREATININE URINE RATIO
Creatinine, Urine: 294 mg/dL — ABNORMAL HIGH (ref 20–275)
Microalb Creat Ratio: 280 mcg/mg creat — ABNORMAL HIGH (ref <30)
Microalb, Ur: 82.3 mg/dL

## 2020-09-07 ENCOUNTER — Ambulatory Visit: Payer: Medicaid Other | Admitting: "Endocrinology

## 2020-09-07 ENCOUNTER — Other Ambulatory Visit: Payer: Self-pay

## 2020-09-07 ENCOUNTER — Ambulatory Visit (INDEPENDENT_AMBULATORY_CARE_PROVIDER_SITE_OTHER): Payer: Self-pay | Admitting: "Endocrinology

## 2020-09-07 ENCOUNTER — Encounter: Payer: Self-pay | Admitting: "Endocrinology

## 2020-09-07 VITALS — BP 140/66 | HR 76 | Ht 61.0 in | Wt 181.8 lb

## 2020-09-07 DIAGNOSIS — E782 Mixed hyperlipidemia: Secondary | ICD-10-CM

## 2020-09-07 DIAGNOSIS — E1121 Type 2 diabetes mellitus with diabetic nephropathy: Secondary | ICD-10-CM

## 2020-09-07 DIAGNOSIS — I10 Essential (primary) hypertension: Secondary | ICD-10-CM

## 2020-09-07 DIAGNOSIS — Z794 Long term (current) use of insulin: Secondary | ICD-10-CM

## 2020-09-07 DIAGNOSIS — N1831 Chronic kidney disease, stage 3a: Secondary | ICD-10-CM

## 2020-09-07 LAB — POCT GLYCOSYLATED HEMOGLOBIN (HGB A1C): Hemoglobin A1C: 6 % — AB (ref 4.0–5.6)

## 2020-09-07 MED ORDER — ROSUVASTATIN CALCIUM 20 MG PO TABS: 20.0000 mg | ORAL_TABLET | Freq: Every day | ORAL | 1 refills | Status: DC

## 2020-09-07 MED ORDER — INSULIN GLARGINE 100 UNIT/ML SOLOSTAR PEN: 40.0000 [IU] | PEN_INJECTOR | Freq: Every day | SUBCUTANEOUS | 2 refills | Status: AC

## 2020-09-07 MED ORDER — INSULIN LISPRO (1 UNIT DIAL) 100 UNIT/ML (KWIKPEN): 10.0000 [IU] | PEN_INJECTOR | Freq: Three times a day (TID) | SUBCUTANEOUS | 2 refills | Status: AC

## 2020-09-07 MED ORDER — LOSARTAN POTASSIUM 25 MG PO TABS: 25.0000 mg | ORAL_TABLET | Freq: Every day | ORAL | 1 refills | Status: DC

## 2020-09-07 NOTE — Patient Instructions (Signed)

## 2020-09-07 NOTE — Progress Notes (Signed)
09/07/2020, 10:00 AM        Endocrinology follow-up note   Subjective:    Patient ID: Ana Wallace, female    DOB: [birth year].  Ana Wallace is being seen in follow up  for management of currently uncontrolled symptomatic type 2 diabetes, hyperlipidemia, hypertension requested by  [facility]   Past Medical History:  Diagnosis Date  . Diabetes mellitus, type II (HCC)   . Hyperlipidemia   . Hypertension    Past Surgical History:  Procedure Laterality Date  . ABDOMINAL HYSTERECTOMY    . VAGINAL PROLAPSE REPAIR     Social History   Socioeconomic History  . Marital status: Single    Spouse name: Not on file  . Number of children: Not on file  . Years of education: Not on file  . Highest education level: Not on file  Occupational History  . Not on file  Tobacco Use  . Smoking status: Never Smoker  . Smokeless tobacco: Never Used  Vaping Use  . Vaping Use: Never used  Substance and Sexual Activity  . Alcohol use: Never  . Drug use: Never  . Sexual activity: Never  Other Topics Concern  . Not on file  Social History Narrative  . Not on file   Social Determinants of Health   Financial Resource Strain:   . Difficulty of Paying Living Expenses: Not on file  Food Insecurity:   . Worried About Programme researcher, broadcasting/film/video in the Last Year: Not on file  . Ran Out of Food in the Last Year: Not on file  Transportation Needs:   . Lack of Transportation (Medical): Not on file  . Lack of Transportation (Non-Medical): Not on file  Physical Activity:   . Days of Exercise per Week: Not on file  . Minutes of Exercise per Session: Not on file  Stress:   . Feeling of Stress : Not on file  Social Connections:   . Frequency of Communication with Friends and Family: Not on file  . Frequency of Social Gatherings with Friends and Family: Not on file  . Attends Religious Services: Not on  file  . Active Member of Clubs or Organizations: Not on file  . Attends Banker Meetings: Not on file  . Marital Status: Not on file   Outpatient Encounter Medications as of 09/07/2020  Medication Sig  . buPROPion (WELLBUTRIN XL) 150 MG 24 hr tablet Take 150 mg by mouth daily.  . busPIRone (BUSPAR) 5 MG tablet Take 5 mg by mouth 2 (two) times daily.  . DULoxetine (CYMBALTA) 60 MG capsule Take 60 mg by mouth daily.  . insulin glargine (LANTUS) 100 UNIT/ML Solostar Pen Inject 40 Units into the skin at bedtime.  . insulin lispro (HUMALOG) 100 UNIT/ML KwikPen Inject 0.1-0.16 mLs (10-16 Units total) into the skin 3 (three) times daily before meals.  Marland Kitchen losartan (COZAAR) 25 MG tablet Take 1 tablet (25 mg total) by mouth daily.  . niacin (NIASPAN) 500 MG CR tablet Take 500 mg by mouth at bedtime.  . pregabalin (LYRICA) 150 MG capsule Take 150 mg by mouth 2 (two) times daily.  . rosuvastatin (CRESTOR) 20 MG tablet  Take 1 tablet (20 mg total) by mouth daily.  . Vitamin D, Ergocalciferol, (DRISDOL) 50000 units CAPS capsule Take 50,000 Units by mouth every 7 (seven) days.  . [DISCONTINUED] Insulin Glargine (LANTUS) 100 UNIT/ML Solostar Pen Inject 60 Units into the skin at bedtime.  . [DISCONTINUED] insulin lispro (HUMALOG) 100 UNIT/ML KwikPen Inject 0.14-0.2 mLs (14-20 Units total) into the skin 3 (three) times daily before meals.  . [DISCONTINUED] losartan (COZAAR) 25 MG tablet Take 1 tablet (25 mg total) by mouth daily.  . [DISCONTINUED] rosuvastatin (CRESTOR) 20 MG tablet Take 20 mg by mouth daily.   No facility-administered encounter medications on file as of 09/07/2020.    ALLERGIES: Allergies  Allergen Reactions  . Nickel Other (See Comments)    Rash with burning sensation    VACCINATION STATUS:  There is no immunization history on file for this patient.  Diabetes She presents for her follow-up diabetic visit. She has type 2 diabetes mellitus. Onset time: She was diagnosed at  approximate age of 59 years. Her disease course has been improving. There are no hypoglycemic associated symptoms. Pertinent negatives for hypoglycemia include no confusion, headaches, pallor or seizures. Associated symptoms include polydipsia and polyuria. Pertinent negatives for diabetes include no chest pain and no polyphagia. There are no hypoglycemic complications. Symptoms are improving. Diabetic complications include nephropathy. Risk factors for coronary artery disease include obesity, dyslipidemia, family history, diabetes mellitus, post-menopausal and sedentary lifestyle. Current diabetic treatment includes insulin injections and oral agent (monotherapy). She is following a generally unhealthy diet. When asked about meal planning, she reported none. She has had a previous visit with a dietitian. She never participates in exercise. Her home blood glucose trend is decreasing steadily. Her breakfast blood glucose range is generally 130-140 mg/dl. Her lunch blood glucose range is generally 130-140 mg/dl. Her dinner blood glucose range is generally 130-140 mg/dl. Her bedtime blood glucose range is generally 130-140 mg/dl. Her overall blood glucose range is 130-140 mg/dl. (She presents with significantly better glycemic profile with some typing.  Her point-of-care A1c is 6%, progressively improving. ) An ACE inhibitor/angiotensin II receptor blocker is being taken.  Hyperlipidemia This is a chronic problem. The current episode started more than 1 year ago. The problem is uncontrolled. Exacerbating diseases include chronic renal disease and diabetes. Pertinent negatives include no chest pain, myalgias or shortness of breath. She is currently on no antihyperlipidemic treatment (She reports side effects from statin medications.). The current treatment provides no improvement of lipids. Risk factors for coronary artery disease include dyslipidemia, diabetes mellitus, a sedentary lifestyle, post-menopausal and  obesity.  Hypertension This is a chronic problem. The current episode started more than 1 year ago. The problem is controlled. Pertinent negatives include no chest pain, headaches, palpitations or shortness of breath. Risk factors for coronary artery disease include dyslipidemia and diabetes mellitus. Past treatments include angiotensin blockers. Identifiable causes of hypertension include chronic renal disease.   Review of systems: Limited as above  Objective:    BP 140/66   Pulse 76   Ht 5\' 1"  (1.549 m)   Wt 181 lb 12.8 oz (82.5 kg)   BMI 34.35 kg/m   Wt Readings from Last 3 Encounters:  09/07/20 181 lb 12.8 oz (82.5 kg)  03/24/20 190 lb (86.2 kg)  12/23/19 184 lb 9.6 oz (83.7 kg)     Recent Results (from the past 2160 hour(s))  COMPLETE METABOLIC PANEL WITH GFR     Status: Abnormal   Collection Time: 07/25/20  9:08 AM  Result Value Ref Range   Glucose, Bld 108 (H) 65 - 99 mg/dL    Comment: .            Fasting reference interval . For someone without known diabetes, a glucose value between 100 and 125 mg/dL is consistent with prediabetes and should be confirmed with a follow-up test. .    BUN 23 7 - 25 mg/dL   Creat 4.09 (H) 8.11 - 1.05 mg/dL    Comment: For patients >66 years of age, the reference limit for Creatinine is approximately 13% higher for people identified as African-American. .    GFR, Est Non African American 35 (L) > OR = 60 mL/min/1.23m2   GFR, Est African American 40 (L) > OR = 60 mL/min/1.51m2   BUN/Creatinine Ratio 14 6 - 22 (calc)   Sodium 143 135 - 146 mmol/L   Potassium 4.8 3.5 - 5.3 mmol/L   Chloride 106 98 - 110 mmol/L   CO2 29 20 - 32 mmol/L   Calcium 9.3 8.6 - 10.4 mg/dL   Total Protein 6.7 6.1 - 8.1 g/dL   Albumin 4.3 3.6 - 5.1 g/dL   Globulin 2.4 1.9 - 3.7 g/dL (calc)   AG Ratio 1.8 1.0 - 2.5 (calc)   Total Bilirubin 0.7 0.2 - 1.2 mg/dL   Alkaline phosphatase (APISO) 47 37 - 153 U/L   AST 24 10 - 35 U/L   ALT 26 6 - 29 U/L  Lipid  Panel     Status: Abnormal   Collection Time: 07/25/20  9:08 AM  Result Value Ref Range   Cholesterol 185 <200 mg/dL   HDL 59 > OR = 50 mg/dL   Triglycerides 82 <914 mg/dL   LDL Cholesterol (Calc) 109 (H) mg/dL (calc)    Comment: Reference range: <100 . Desirable range <100 mg/dL for primary prevention;   <70 mg/dL for patients with CHD or diabetic patients  with > or = 2 CHD risk factors. Marland Kitchen LDL-C is now calculated using the Martin-Hopkins  calculation, which is a validated novel method providing  better accuracy than the Friedewald equation in the  estimation of LDL-C.  Horald Pollen et al. Lenox Ahr. 7829;562(13): 2061-2068  (http://education.[facility].com/faq/FAQ164)    Total CHOL/HDL Ratio 3.1 <5.0 (calc)   Non-HDL Cholesterol (Calc) 126 <130 mg/dL (calc)    Comment: For patients with diabetes plus 1 major ASCVD risk  factor, treating to a non-HDL-C goal of <100 mg/dL  (LDL-C of <08 mg/dL) is considered a therapeutic  option.   Microalbumin/Creatinine Ratio, Urine     Status: Abnormal   Collection Time: 07/25/20  9:08 AM  Result Value Ref Range   Creatinine, Urine 294 (H) 20 - 275 mg/dL   Microalb, Ur 65.7 mg/dL    Comment: Verified by repeat analysis. Marland Kitchen Reference Range Not established    Microalb Creat Ratio 280 (H) <30 mcg/mg creat    Comment: . The ADA defines abnormalities in albumin excretion as follows: Marland Kitchen Category         Result (mcg/mg creatinine) . Normal                    <30 Microalbuminuria         30-299  Clinical albuminuria   > OR = 300 . The ADA recommends that at least two of three specimens collected within a 3-6 month period be abnormal before considering a patient to be within a diagnostic category.   HgB A1c     Status:  Abnormal   Collection Time: 09/07/20  8:28 AM  Result Value Ref Range   Hemoglobin A1C 6.0 (A) 4.0 - 5.6 %   HbA1c POC (<> result, manual entry)     HbA1c, POC (prediabetic range)     HbA1c, POC (controlled diabetic  range)       Assessment & Plan:   1. Type 2 diabetes mellitus with stage 3 chronic kidney disease, with long-term current use of insulin (HCC)  - Lindalou HoseShelby Elderkin has currently uncontrolled symptomatic type 2 DM since 59 years of age. - She presents with tightly controlled glycemic profile both fasting and postprandial.  Her point-of-care A1c 6%, overall improving from 8.8%.   She did have rare, random, mild hypoglycemic episodes.   Her labs also show significant improvement in her renal function.   -her diabetes is complicated by stage 2 -3 renal insufficiency (improving) and Lindalou HoseShelby Skluzacek remains at a high risk for more acute and chronic complications which include CAD, CVA, CKD, retinopathy, and neuropathy. These are all discussed in detail with the patient.  - I have counseled her on diet management and weight loss, by adopting a carbohydrate restricted/protein rich diet.  - she  admits there is a room for improvement in her diet and drink choices. -  Suggestion is made for her to avoid simple carbohydrates  from her diet including Cakes, Sweet Desserts / Pastries, Ice Cream, Soda (diet and regular), Sweet Tea, Candies, Chips, Cookies, Sweet Pastries,  Store Bought Juices, Alcohol in Excess of  1-2 drinks a day, Artificial Sweeteners, Coffee Creamer, and "Sugar-free" Products. This will help patient to have stable blood glucose profile and potentially avoid unintended weight gain.  - I encouraged her to switch to  unprocessed or minimally processed complex starch and increased protein intake (animal or plant source), fruits, and vegetables.  - she is advised to stick to a routine mealtimes to eat 3 meals  a day and avoid unnecessary snacks ( to snack only to correct hypoglycemia).    - I have approached her with the following individualized plan to manage diabetes and patient agrees:   -She will benefit from de-escalation in her insulin doses, will still continue to need intensive  treatment with basal/bolus insulin in order for her to maintain control of diabetes to target.    -She is advised to lower her Lantus to 40 units nightly, lower Humalog to 10-16 units 3 times a day with meals for pre-meal blood glucose readings above 90 mg/dL, associated with strict monitoring of blood glucose 4 times a day- before meals and at bedtime.   -Patient is not a candidate for SGLT2 inhibitors due to CKD.  - Patient specific target  A1c;  LDL, HDL, Triglycerides, and  Waist Circumference were discussed in detail.  2) BP/HTN:  Her blood pressure is controlled to target.   She is on azilsartan.  She is advised to be consistent with her blood pressure medications and salt restrictions.  I will refill her losartan.  3) Lipids/HPL: Her previsit lipid panel showed LDL improving to 109 from  200+.  She reports better tolerance and compliance with Crestor 20 mg p.o. nightly.  She is advised to continue.    4)  Weight/Diet: CDE Consult is in progress, exercise, and detailed carbohydrates information provided.  5) Chronic Care/Health Maintenance:  -she  is on ARB and Statin medications and  is encouraged to continue to follow up with Ophthalmology, Dentist,  Podiatrist at least yearly or according to recommendations,  and advised to  stay away from smoking. I have recommended yearly flu vaccine and pneumonia vaccination at least every 5 years; moderate intensity exercise for up to 150 minutes weekly; and  sleep for at least 7 hours a day.  - I advised patient to maintain close follow up with [facility] for primary care needs.    - Time spent on this patient care encounter:  35 min, of which > 50% was spent in  counseling and the rest reviewing her blood glucose logs , discussing her hypoglycemia and hyperglycemia episodes, reviewing her current and  previous labs / studies  ( including abstraction from other facilities) and medications  doses and developing a  long  term treatment plan and documenting her care.   Please refer to Patient Instructions for Blood Glucose Monitoring and Insulin/Medications Dosing Guide"  in media tab for additional information. Please  also refer to " Patient Self Inventory" in the Media  tab for reviewed elements of pertinent patient history.  Lindalou Hose participated in the discussions, expressed understanding, and voiced agreement with the above plans.  All questions were answered to her satisfaction. she is encouraged to contact clinic should she have any questions or concerns prior to her return visit.  Follow up plan: - Return in about 3 months (around 12/07/2020) for Bring Meter and Logs- A1c in Office.  Marquis Lunch, MD [facility] [address] [place], [place] [address] Phone: [phone]  Fax: [phone]    09/07/2020, 10:00 AM  This note was partially dictated with voice recognition software. Similar sounding words can be transcribed inadequately or may not  be corrected upon review.

## 2020-12-15 ENCOUNTER — Ambulatory Visit: Payer: Medicaid Other | Admitting: Nurse Practitioner

## 2020-12-19 ENCOUNTER — Encounter: Payer: Self-pay | Admitting: Nurse Practitioner

## 2020-12-19 ENCOUNTER — Other Ambulatory Visit: Payer: Self-pay

## 2020-12-19 ENCOUNTER — Ambulatory Visit (INDEPENDENT_AMBULATORY_CARE_PROVIDER_SITE_OTHER): Payer: Self-pay | Admitting: Nurse Practitioner

## 2020-12-19 VITALS — BP 146/75 | HR 67 | Ht 61.0 in | Wt 180.0 lb

## 2020-12-19 DIAGNOSIS — E782 Mixed hyperlipidemia: Secondary | ICD-10-CM

## 2020-12-19 DIAGNOSIS — E1122 Type 2 diabetes mellitus with diabetic chronic kidney disease: Secondary | ICD-10-CM

## 2020-12-19 DIAGNOSIS — N1831 Chronic kidney disease, stage 3a: Secondary | ICD-10-CM

## 2020-12-19 DIAGNOSIS — I10 Essential (primary) hypertension: Secondary | ICD-10-CM

## 2020-12-19 DIAGNOSIS — Z794 Long term (current) use of insulin: Secondary | ICD-10-CM

## 2020-12-19 LAB — POCT GLYCOSYLATED HEMOGLOBIN (HGB A1C): HbA1c, POC (controlled diabetic range): 8.6 % — AB (ref 0.0–7.0)

## 2020-12-19 NOTE — Progress Notes (Signed)
12/19/2020, 10:48 AM        Endocrinology follow-up note   Subjective:    Patient ID: Ana Wallace, female    DOB: 1961-06-10.  Brandin Dilday is being seen in follow up  for management of currently uncontrolled symptomatic type 2 diabetes, hyperlipidemia, hypertension requested by  Erasmo Downer, NP.   Past Medical History:  Diagnosis Date   Diabetes mellitus, type II (HCC)    Hyperlipidemia    Hypertension    Past Surgical History:  Procedure Laterality Date   ABDOMINAL HYSTERECTOMY     VAGINAL PROLAPSE REPAIR     Social History   Socioeconomic History   Marital status: Single    Spouse name: Not on file   Number of children: Not on file   Years of education: Not on file   Highest education level: Not on file  Occupational History   Not on file  Tobacco Use   Smoking status: Never Smoker   Smokeless tobacco: Never Used  Vaping Use   Vaping Use: Never used  Substance and Sexual Activity   Alcohol use: Never   Drug use: Never   Sexual activity: Never  Other Topics Concern   Not on file  Social History Narrative   Not on file   Social Determinants of Health   Financial Resource Strain: Not on file  Food Insecurity: Not on file  Transportation Needs: Not on file  Physical Activity: Not on file  Stress: Not on file  Social Connections: Not on file   Outpatient Encounter Medications as of 12/19/2020  Medication Sig   baclofen (LIORESAL) 10 MG tablet Take 10 mg by mouth 3 (three) times daily.   busPIRone (BUSPAR) 5 MG tablet Take 5 mg by mouth 2 (two) times daily.   diphenhydrAMINE (BENADRYL) 25 mg capsule 1 capsule as needed   DULoxetine (CYMBALTA) 60 MG capsule Take 60 mg by mouth daily.   Glucagon (GVOKE HYPOPEN 2-PACK) 1 MG/0.2ML SOAJ as needed for severe hypoglycemia   insulin glargine (LANTUS) 100 UNIT/ML Solostar Pen Inject 40 Units into the skin at  bedtime.   insulin lispro (HUMALOG) 100 UNIT/ML KwikPen Inject 0.1-0.16 mLs (10-16 Units total) into the skin 3 (three) times daily before meals.   losartan (COZAAR) 25 MG tablet Take 1 tablet (25 mg total) by mouth daily.   pregabalin (LYRICA) 150 MG capsule Take 150 mg by mouth 2 (two) times daily.   rosuvastatin (CRESTOR) 20 MG tablet Take 1 tablet (20 mg total) by mouth daily.   Vitamin D, Ergocalciferol, (DRISDOL) 50000 units CAPS capsule Take 50,000 Units by mouth every 7 (seven) days.   [DISCONTINUED] buPROPion (WELLBUTRIN XL) 150 MG 24 hr tablet Take 150 mg by mouth daily.   [DISCONTINUED] niacin (NIASPAN) 500 MG CR tablet Take 500 mg by mouth at bedtime.   No facility-administered encounter medications on file as of 12/19/2020.    ALLERGIES: Allergies  Allergen Reactions   Nickel Other (See Comments)    Rash with burning sensation    VACCINATION STATUS:  There is no immunization history on file for this patient.  Diabetes She presents for her follow-up diabetic visit. She has type 2 diabetes mellitus. Onset time:  She was diagnosed at approximate age of 47 years. Her disease course has been worsening. There are no hypoglycemic associated symptoms. Pertinent negatives for hypoglycemia include no confusion, headaches, pallor or seizures. Associated symptoms include fatigue and polyuria. Pertinent negatives for diabetes include no chest pain, no polydipsia and no polyphagia. There are no hypoglycemic complications. Symptoms are worsening. Diabetic complications include nephropathy. Risk factors for coronary artery disease include obesity, dyslipidemia, family history, diabetes mellitus, post-menopausal, sedentary lifestyle and hypertension. Current diabetic treatment includes insulin injections. She is compliant with treatment most of the time. Her weight is stable. She is following a generally unhealthy diet. When asked about meal planning, she reported none. She has had a  previous visit with a dietitian. She never participates in exercise. Her home blood glucose trend is increasing steadily. (She presents today with no meter or logs to review.  She reports she does monitor her glucose regularly.  Her POCT A1c today is 8.6%, increasing from last visit of 6.0%.  She reports her diet has suffered recently, admits to consuming fried foods and frequent snacks.  She denies any hypoglycemia. ) An ACE inhibitor/angiotensin II receptor blocker is being taken. She does not see a podiatrist.Eye exam is current.  Hyperlipidemia This is a chronic problem. The current episode started more than 1 year ago. The problem is uncontrolled. Recent lipid tests were reviewed and are high. Exacerbating diseases include chronic renal disease and diabetes. Factors aggravating her hyperlipidemia include fatty foods. Pertinent negatives include no chest pain, myalgias or shortness of breath. Current antihyperlipidemic treatment includes statins. The current treatment provides no improvement of lipids. Compliance problems include adherence to diet, adherence to exercise and medication side effects.  Risk factors for coronary artery disease include dyslipidemia, diabetes mellitus, a sedentary lifestyle, post-menopausal, obesity and hypertension.  Hypertension This is a chronic problem. The current episode started more than 1 year ago. The problem has been gradually worsening since onset. The problem is uncontrolled. Pertinent negatives include no chest pain, headaches, palpitations or shortness of breath. There are no associated agents to hypertension. Risk factors for coronary artery disease include dyslipidemia, diabetes mellitus, obesity and sedentary lifestyle. Past treatments include angiotensin blockers. The current treatment provides mild improvement. Hypertensive end-organ damage includes kidney disease. Identifiable causes of hypertension include chronic renal disease.   Review of  systems  Constitutional: + Minimally fluctuating body weight,  current Body mass index is 34.01 kg/m. , + fatigue, no subjective hyperthermia, no subjective hypothermia Eyes: no blurry vision, no xerophthalmia ENT: no sore throat, no nodules palpated in throat, no dysphagia/odynophagia, no hoarseness Cardiovascular: no chest pain, no shortness of breath, no palpitations, no leg swelling Respiratory: no cough, no shortness of breath Gastrointestinal: no nausea/vomiting/diarrhea Musculoskeletal: no muscle/joint aches Skin: no rashes, no hyperemia, reports noticing small knots on her right leg  Neurological: no tremors, no numbness, no tingling, no dizziness Psychiatric: no depression, no anxiety  Objective:    BP (!) 146/75 (BP Location: Left Arm)    Pulse 67    Ht 5\' 1"  (1.549 m)    Wt 180 lb (81.6 kg)    BMI 34.01 kg/m   Wt Readings from Last 3 Encounters:  12/19/20 180 lb (81.6 kg)  09/07/20 181 lb 12.8 oz (82.5 kg)  03/24/20 190 lb (86.2 kg)    BP Readings from Last 3 Encounters:  12/19/20 (!) 146/75  09/07/20 140/66  12/23/19 134/71     Physical Exam- Limited  Constitutional:  Body mass index is 34.01 kg/m. ,  not in acute distress, normal state of mind Eyes:  EOMI, no exophthalmos Neck: Supple Cardiovascular: RRR, no murmers, rubs, or gallops, no edema Respiratory: Adequate breathing efforts, no crackles, rales, rhonchi, or wheezing Musculoskeletal: no gross deformities, strength intact in all four extremities, no gross restriction of joint movements Skin:  no rashes, no hyperemia, small spongy knots on her legs appear to be varicosities. Neurological: no tremor with outstretched hands   POCT ABI Results 12/19/20   Right ABI:  1.01      Left ABI:  1.08  Right leg systolic / diastolic: 148/76 mmHg Left leg systolic / diastolic: 158/75 mmHg  Arm systolic / diastolic: 146/75 mmHG  Detailed report will be scanned into patient chart.    Foot exam:   No rashes,  ulcers, cuts, calluses, onychodystrophy.   Good pulses bilat.  Good sensation to 10 g monofilament bilat.  Ingrown toenails to bilateral great toes   Recent Results (from the past 2160 hour(s))  HgB A1c     Status: Abnormal   Collection Time: 12/19/20 10:43 AM  Result Value Ref Range   Hemoglobin A1C     HbA1c POC (<> result, manual entry)     HbA1c, POC (prediabetic range)     HbA1c, POC (controlled diabetic range) 8.6 (A) 0.0 - 7.0 %     Assessment & Plan:   1) Type 2 diabetes mellitus with stage 3 chronic kidney disease, with long-term current use of insulin (HCC)  - Karle Desrosier has currently uncontrolled symptomatic type 2 DM since 59 years of age.  She presents today with no meter or logs to review.  She reports she does monitor her glucose regularly.  Her POCT A1c today is 8.6%, increasing from last visit of 6.0%.  She reports her diet has suffered recently, admits to consuming fried foods and frequent snacks.  She denies any hypoglycemia.   -Her recent labs reviewed.   -her diabetes is complicated by stage 2 -3 renal insufficiency (improving) and Cailie Bosshart remains at a high risk for more acute and chronic complications which include CAD, CVA, CKD, retinopathy, and neuropathy. These are all discussed in detail with the patient.  - Nutritional counseling repeated at each appointment due to patients tendency to fall back in to old habits.  - The patient admits there is a room for improvement in their diet and drink choices. -  Suggestion is made for the patient to avoid simple carbohydrates from their diet including Cakes, Sweet Desserts / Pastries, Ice Cream, Soda (diet and regular), Sweet Tea, Candies, Chips, Cookies, Sweet Pastries,  Store Bought Juices, Alcohol in Excess of  1-2 drinks a day, Artificial Sweeteners, Coffee Creamer, and "Sugar-free" Products. This will help patient to have stable blood glucose profile and potentially avoid unintended weight gain.    - I encouraged the patient to switch to  unprocessed or minimally processed complex starch and increased protein intake (animal or plant source), fruits, and vegetables.   - Patient is advised to stick to a routine mealtimes to eat 3 meals  a day and avoid unnecessary snacks ( to snack only to correct hypoglycemia).  - I have approached her with the following individualized plan to manage diabetes and patient agrees:   -Given the lack of logs or meter to review, no changes will be made to her medication regimen today.  She is advised to work on diet and exercise along with sticking to her diabetes management program.  -She is advised to continue Lantus  40 units SQ daily at bedtime, continue Humalog 10-16 units TID with meals if glucose is above 90 and she is eating.  Specific instructions on how to titrate insulin dose based on glucose readings given to patient in writing.  -She is encouraged to continue monitoring blood glucose 4 times per day, before meals and before bed, and to call the clinic if she has readings less than 70 or greater than 300 for 3 tests in a row.  -Patient is not a candidate for SGLT2 inhibitors due to CKD.  - Patient specific target  A1c;  LDL, HDL, Triglycerides, and  Waist Circumference were discussed in detail.  2) BP/HTN:  Her blood pressure is not controlled to target today.  She reports she had not long taken her medication prior to today's visit.  She is advised to continue Losartan 25 mg po daily.  3) Lipids/HPL:  Her most recent lipid panel from 07/25/20 shows uncontrolled LDL of 109 (improving).  She is advised to continue Crestor 20 mg po daily at bedtime.  Side effects and precautions discussed with her.  4)  Weight/Diet:  Her Body mass index is 34.01 kg/m.--clearly complicating her diabetes care.  She is a candidate for modest weight loss.  CDE Consult is in progress, exercise, and detailed carbohydrates information provided.  5) Chronic Care/Health  Maintenance: -she  is on ARB and Statin medications and  is encouraged to continue to follow up with Ophthalmology, Dentist,  Podiatrist at least yearly or according to recommendations, and advised to  stay away from smoking. I have recommended yearly flu vaccine and pneumonia vaccination at least every 5 years; moderate intensity exercise for up to 150 minutes weekly; and  sleep for at least 7 hours a day.  - I advised patient to maintain close follow up with Erasmo DownerStrader, Lindsey F, NP for primary care needs.    - Time spent on this patient care encounter:  35 min, of which > 50% was spent in  counseling and the rest reviewing her blood glucose logs , discussing her hypoglycemia and hyperglycemia episodes, reviewing her current and  previous labs / studies  ( including abstraction from other facilities) and medications  doses and developing a  long term treatment plan and documenting her care.   Please refer to Patient Instructions for Blood Glucose Monitoring and Insulin/Medications Dosing Guide"  in media tab for additional information. Please  also refer to " Patient Self Inventory" in the Media  tab for reviewed elements of pertinent patient history.  Lindalou HoseShelby Phagan participated in the discussions, expressed understanding, and voiced agreement with the above plans.  All questions were answered to her satisfaction. she is encouraged to contact clinic should she have any questions or concerns prior to her return visit.  Follow up plan: - Return in about 3 months (around 03/19/2021) for Diabetes follow up with A1c in office, Previsit labs, Bring glucometer and logs.  Ronny BaconWhitney Nyaira Hodgens, [facility] Va Medical CenterFNP-BC [facility] [address] [place], [place] [address] Phone: [phone] Fax: [phone]  12/19/2020, 10:48 AM

## 2020-12-19 NOTE — Patient Instructions (Signed)

## 2021-03-21 ENCOUNTER — Ambulatory Visit: Payer: Medicaid Other | Admitting: Nurse Practitioner

## 2021-03-30 LAB — COMPREHENSIVE METABOLIC PANEL
Calcium: 9.6 (ref 8.7–10.7)
GFR calc Af Amer: 43
GFR calc non Af Amer: 37

## 2021-03-30 LAB — LIPID PANEL
Cholesterol: 189 (ref 0–200)
HDL: 54 (ref 35–70)
LDL Cholesterol: 114
Triglycerides: 100 (ref 40–160)

## 2021-03-30 LAB — BASIC METABOLIC PANEL
BUN: 40 — AB (ref 4–21)
Creatinine: 1.5 — AB (ref 0.5–1.1)

## 2021-03-30 LAB — VITAMIN D 25 HYDROXY (VIT D DEFICIENCY, FRACTURES): Vit D, 25-Hydroxy: 53

## 2021-03-30 LAB — HEMOGLOBIN A1C: Hemoglobin A1C: 8.6

## 2021-04-06 ENCOUNTER — Ambulatory Visit (INDEPENDENT_AMBULATORY_CARE_PROVIDER_SITE_OTHER): Payer: Self-pay | Admitting: Nurse Practitioner

## 2021-04-06 ENCOUNTER — Encounter: Payer: Self-pay | Admitting: Nurse Practitioner

## 2021-04-06 VITALS — BP 149/83 | HR 73 | Ht 61.0 in | Wt 177.4 lb

## 2021-04-06 DIAGNOSIS — N1832 Chronic kidney disease, stage 3b: Secondary | ICD-10-CM

## 2021-04-06 DIAGNOSIS — E1122 Type 2 diabetes mellitus with diabetic chronic kidney disease: Secondary | ICD-10-CM

## 2021-04-06 DIAGNOSIS — E782 Mixed hyperlipidemia: Secondary | ICD-10-CM

## 2021-04-06 DIAGNOSIS — Z794 Long term (current) use of insulin: Secondary | ICD-10-CM

## 2021-04-06 DIAGNOSIS — I10 Essential (primary) hypertension: Secondary | ICD-10-CM

## 2021-04-06 MED ORDER — FREESTYLE LIBRE 2 SENSOR MISC: 3 refills | Status: AC

## 2021-04-06 NOTE — Progress Notes (Signed)
04/06/2021, 2:44 PM        Endocrinology follow-up note   Subjective:    Patient ID: Ana Wallace, female    DOB: April 09, 1961.  Ana Wallace is being seen in follow up  for management of currently uncontrolled symptomatic type 2 diabetes, hyperlipidemia, hypertension requested by  Erasmo DownerStrader, Lindsey F, NP.   Past Medical History:  Diagnosis Date  . Diabetes mellitus, type II (HCC)   . Hyperlipidemia   . Hypertension    Past Surgical History:  Procedure Laterality Date  . ABDOMINAL HYSTERECTOMY    . VAGINAL PROLAPSE REPAIR     Social History   Socioeconomic History  . Marital status: Single    Spouse name: Not on file  . Number of children: Not on file  . Years of education: Not on file  . Highest education level: Not on file  Occupational History  . Not on file  Tobacco Use  . Smoking status: Never Smoker  . Smokeless tobacco: Never Used  Vaping Use  . Vaping Use: Never used  Substance and Sexual Activity  . Alcohol use: Never  . Drug use: Never  . Sexual activity: Never  Other Topics Concern  . Not on file  Social History Narrative  . Not on file   Social Determinants of Health   Financial Resource Strain: Not on file  Food Insecurity: Not on file  Transportation Needs: Not on file  Physical Activity: Not on file  Stress: Not on file  Social Connections: Not on file   Outpatient Encounter Medications as of 04/06/2021  Medication Sig  . baclofen (LIORESAL) 10 MG tablet Take 10 mg by mouth 3 (three) times daily.  . busPIRone (BUSPAR) 5 MG tablet Take 5 mg by mouth 2 (two) times daily.  . Continuous Blood Gluc Sensor (FREESTYLE LIBRE 2 SENSOR) MISC Change sensor every 14 days as directed  . diphenhydrAMINE (BENADRYL) 25 mg capsule 1 capsule as needed  . DULoxetine (CYMBALTA) 60 MG capsule Take 60 mg by mouth daily.  . Glucagon (GVOKE HYPOPEN 2-PACK) 1 MG/0.2ML SOAJ as needed for severe  hypoglycemia  . insulin glargine (LANTUS) 100 UNIT/ML Solostar Pen Inject 40 Units into the skin at bedtime.  . insulin lispro (HUMALOG) 100 UNIT/ML KwikPen Inject 0.1-0.16 mLs (10-16 Units total) into the skin 3 (three) times daily before meals.  Marland Kitchen. losartan (COZAAR) 25 MG tablet Take 1 tablet (25 mg total) by mouth daily.  . pregabalin (LYRICA) 150 MG capsule Take 150 mg by mouth 2 (two) times daily.  . rosuvastatin (CRESTOR) 20 MG tablet Take 1 tablet (20 mg total) by mouth daily.  . Vitamin D, Ergocalciferol, (DRISDOL) 50000 units CAPS capsule Take 50,000 Units by mouth every 7 (seven) days.   No facility-administered encounter medications on file as of 04/06/2021.    ALLERGIES: Allergies  Allergen Reactions  . Nickel Other (See Comments)    Rash with burning sensation    VACCINATION STATUS:  There is no immunization history on file for this patient.  Diabetes She presents for her follow-up diabetic visit. She has type 2 diabetes mellitus. Onset time: She was diagnosed at approximate age of 47 years. Her disease course has been stable.  There are no hypoglycemic associated symptoms. Pertinent negatives for hypoglycemia include no confusion, headaches, pallor or seizures. Associated symptoms include fatigue and polyuria. Pertinent negatives for diabetes include no chest pain, no polydipsia and no polyphagia. There are no hypoglycemic complications. Symptoms are stable. Diabetic complications include nephropathy. Risk factors for coronary artery disease include obesity, dyslipidemia, family history, diabetes mellitus, post-menopausal, sedentary lifestyle and hypertension. Current diabetic treatment includes intensive insulin program. She is compliant with treatment most of the time. Her weight is fluctuating minimally. She is following a generally unhealthy diet. When asked about meal planning, she reported none. She has had a previous visit with a dietitian. She never participates in exercise.  Her home blood glucose trend is fluctuating minimally. Her breakfast blood glucose range is generally 130-140 mg/dl. Her lunch blood glucose range is generally 140-180 mg/dl. Her dinner blood glucose range is generally 140-180 mg/dl. Her bedtime blood glucose range is generally 180-200 mg/dl. (She presents today with her meter and logs showing slightly above target fasting and postprandial glycemic profile overall.  Her recent A1c done at her PCP office was 8.6%, unchanged from previous visit.  She reports she had COVID in January and did not check her glucose at that time due to her illness.  According to her logs, she has missed some opportunities to inject short acting insulin due to missed glucose readings.) An ACE inhibitor/angiotensin II receptor blocker is being taken. She does not see a podiatrist.Eye exam is current.  Hyperlipidemia This is a chronic problem. The current episode started more than 1 year ago. The problem is uncontrolled. Recent lipid tests were reviewed and are high. Exacerbating diseases include chronic renal disease and diabetes. Factors aggravating her hyperlipidemia include fatty foods. Pertinent negatives include no chest pain, myalgias or shortness of breath. Current antihyperlipidemic treatment includes statins. The current treatment provides no improvement of lipids. Compliance problems include adherence to diet, adherence to exercise and medication side effects.  Risk factors for coronary artery disease include dyslipidemia, diabetes mellitus, a sedentary lifestyle, post-menopausal, obesity and hypertension.  Hypertension This is a chronic problem. The current episode started more than 1 year ago. The problem is unchanged. The problem is uncontrolled. Pertinent negatives include no chest pain, headaches, palpitations or shortness of breath. There are no associated agents to hypertension. Risk factors for coronary artery disease include dyslipidemia, diabetes mellitus, obesity  and sedentary lifestyle. Past treatments include angiotensin blockers. The current treatment provides mild improvement. Compliance problems include exercise and diet.  Hypertensive end-organ damage includes kidney disease. Identifiable causes of hypertension include chronic renal disease.   Review of systems  Constitutional: + Minimally fluctuating body weight,  current Body mass index is 33.52 kg/m. , + fatigue, no subjective hyperthermia, no subjective hypothermia Eyes: no blurry vision, no xerophthalmia ENT: no sore throat, no nodules palpated in throat, no dysphagia/odynophagia, no hoarseness Cardiovascular: no chest pain, no shortness of breath, no palpitations, no leg swelling Respiratory: no cough, no shortness of breath Gastrointestinal: no nausea/vomiting/diarrhea Musculoskeletal: no muscle/joint aches Skin: no rashes, no hyperemia Neurological: no tremors, no numbness, no tingling, no dizziness Psychiatric: no depression, no anxiety  Objective:    BP (!) 149/83 (BP Location: Left Arm, Patient Position: Sitting)   Pulse 73   Ht  (1.549 m)   Wt 177 lb 6.4 oz (80.5 kg)   BMI 33.52 kg/m   Wt Readings from Last 3 Encounters:  04/06/21 177 lb 6.4 oz (80.5 kg)  12/19/20 180 lb (81.6 kg)  09/07/20 181  lb 12.8 oz (82.5 kg)    BP Readings from Last 3 Encounters:  04/06/21 (!) 149/83  12/19/20 (!) 146/75  09/07/20 140/66    Physical Exam- Limited  Constitutional:  Body mass index is 33.52 kg/m. , not in acute distress, normal state of mind Eyes:  EOMI, no exophthalmos Neck: Supple Cardiovascular: RRR, no murmers, rubs, or gallops, no edema Respiratory: Adequate breathing efforts, no crackles, rales, rhonchi, or wheezing Musculoskeletal: no gross deformities, strength intact in all four extremities, no gross restriction of joint movements Skin:  no rashes, no hyperemia Neurological: no tremor with outstretched hands    Recent Results (from the past 2160 hour(s))   VITAMIN D 25 Hydroxy (Vit-D Deficiency, Fractures)     Status: None   Collection Time: 03/30/21 12:00 AM  Result Value Ref Range   Vit D, 25-Hydroxy 53   Basic metabolic panel     Status: Abnormal   Collection Time: 03/30/21 12:00 AM  Result Value Ref Range   BUN 40 (A) 4 - 21   Creatinine 1.5 (A) 0.5 - 1.1  Comprehensive metabolic panel     Status: None   Collection Time: 03/30/21 12:00 AM  Result Value Ref Range   GFR calc Af Amer 43    GFR calc non Af Amer 37    Calcium 9.6 8.7 - 10.7  Lipid panel     Status: None   Collection Time: 03/30/21 12:00 AM  Result Value Ref Range   Triglycerides 100 40 - 160   Cholesterol 189 0 - 200   HDL 54 35 - 70   LDL Cholesterol 114   Hemoglobin A1c     Status: None   Collection Time: 03/30/21 12:00 AM  Result Value Ref Range   Hemoglobin A1C 8.6      Assessment & Plan:   1) Type 2 diabetes mellitus with stage 3 chronic kidney disease, with long-term current use of insulin (HCC)  - Ana Wallace has currently uncontrolled symptomatic type 2 DM since 60 years of age.  She presents today with her meter and logs showing slightly above target fasting and postprandial glycemic profile overall.  Her recent A1c done at her PCP office was 8.6%, unchanged from previous visit.  She reports she had COVID in January and did not check her glucose at that time due to her illness.  According to her logs, she has missed some opportunities to inject short acting insulin due to missed glucose readings.  There are no episodes of hypoglycemia reported or recorded.   -Her recent labs reviewed.   -her diabetes is complicated by stage 3 renal insufficiency (stable) and Ana Wallace remains at a high risk for more acute and chronic complications which include CAD, CVA, CKD, retinopathy, and neuropathy. These are all discussed in detail with the patient.  - Nutritional counseling repeated at each appointment due to patients tendency to fall back in to old  habits.  - The patient admits there is a room for improvement in their diet and drink choices. -  Suggestion is made for the patient to avoid simple carbohydrates from their diet including Cakes, Sweet Desserts / Pastries, Ice Cream, Soda (diet and regular), Sweet Tea, Candies, Chips, Cookies, Sweet Pastries,  Store Bought Juices, Alcohol in Excess of  1-2 drinks a day, Artificial Sweeteners, Coffee Creamer, and "Sugar-free" Products. This will help patient to have stable blood glucose profile and potentially avoid unintended weight gain.   - I encouraged the patient to switch to  unprocessed or minimally processed complex starch and increased protein intake (animal or plant source), fruits, and vegetables.   - Patient is advised to stick to a routine mealtimes to eat 3 meals  a day and avoid unnecessary snacks ( to snack only to correct hypoglycemia).  - I have approached her with the following individualized plan to manage diabetes and patient agrees:   -She is advised to work on diet and exercise along with sticking to her diabetes management program.  -Given her near target fasting and postprandial glycemic profile, she is advised to continue Lantus 40 units SQ daily at bedtime, continue Humalog 10-16 units TID with meals if glucose is above 90 and she is eating.  Specific instructions on how to titrate insulin dose based on glucose readings given to patient in writing.  -She is encouraged to continue monitoring blood glucose 4 times per day, before meals and before bed, and to call the clinic if she has readings less than 70 or greater than 300 for 3 tests in a row.  She is interested in CGM.  Samples provided for Libre 2 from office and sent to her local pharmacy for review.  -Patient is not a candidate for Metformin or SGLT2 inhibitors due to CKD.  - Patient specific target  A1c;  LDL, HDL, Triglycerides, and  Waist Circumference were discussed in detail.  2) BP/HTN:  Her blood pressure  is still not controlled to target today.  She is advised to continue Losartan 25 mg po daily.   3) Lipids/HPL:  Her most recent lipid panel from 03/30/21 shows uncontrolled LDL of 114 (worsening slightly).  She is advised to continue Crestor 20 mg po daily at bedtime.  Side effects and precautions discussed with her.  4)  Weight/Diet:  Her Body mass index is 33.52 kg/m.--clearly complicating her diabetes care.  She is a candidate for modest weight loss.  CDE Consult is in progress, exercise, and detailed carbohydrates information provided.  5) Chronic Care/Health Maintenance: -she is on ARB and Statin medications and is encouraged to continue to follow up with Ophthalmology, Dentist,  Podiatrist at least yearly or according to recommendations, and advised to  stay away from smoking. I have recommended yearly flu vaccine and pneumonia vaccination at least every 5 years; moderate intensity exercise for up to 150 minutes weekly; and  sleep for at least 7 hours a day.  - I advised patient to maintain close follow up with Erasmo Downer, NP for primary care needs.      I spent 41 minutes in the care of the patient today including review of labs from CMP, Lipids, Thyroid Function, Hematology (current and previous including abstractions from other facilities); face-to-face time discussing  her blood glucose readings/logs, discussing hypoglycemia and hyperglycemia episodes and symptoms, medications doses, her options of short and long term treatment based on the latest standards of care / guidelines;  discussion about incorporating lifestyle medicine;  and documenting the encounter.    Please refer to Patient Instructions for Blood Glucose Monitoring and Insulin/Medications Dosing Guide"  in media tab for additional information. Please  also refer to " Patient Self Inventory" in the Media  tab for reviewed elements of pertinent patient history.  Ana Wallace participated in the discussions,  expressed understanding, and voiced agreement with the above plans.  All questions were answered to her satisfaction. she is encouraged to contact clinic should she have any questions or concerns prior to her return visit.    Follow up  plan: - Return in about 3 months (around 07/06/2021) for Diabetes follow up with A1c in office, No previsit labs, Bring glucometer and logs.  Ronny Bacon, [facility] [address] [place], [place] [address] Phone: [phone] Fax: [phone]  04/06/2021, 2:44 PM

## 2021-04-06 NOTE — Patient Instructions (Signed)

## 2021-04-13 ENCOUNTER — Other Ambulatory Visit: Payer: Self-pay | Admitting: "Endocrinology

## 2021-05-10 ENCOUNTER — Telehealth: Payer: Self-pay | Admitting: Nurse Practitioner

## 2021-05-10 MED ORDER — FREESTYLE LIBRE 2 READER DEVI: 0 refills | Status: AC

## 2021-05-10 NOTE — Telephone Encounter (Signed)
Rx for freestyle libre 2 reader sent to pharmacy.

## 2021-05-10 NOTE — Telephone Encounter (Signed)
Pt is calling and states her freestyle Josephine Igo is not compatible with her cell phone and they told her that she needs the [place] meter.. please advise  [facility] [address], [place] - [address] Phone:  [phone]  Fax:  [phone]

## 2021-07-06 ENCOUNTER — Ambulatory Visit: Payer: Medicaid Other | Admitting: Nurse Practitioner

## 2021-07-10 ENCOUNTER — Other Ambulatory Visit: Payer: Self-pay | Admitting: "Endocrinology

## 2021-07-31 ENCOUNTER — Other Ambulatory Visit: Payer: Self-pay | Admitting: Nurse Practitioner

## 2021-11-16 ENCOUNTER — Other Ambulatory Visit: Payer: Self-pay | Admitting: Nurse Practitioner

## 2021-12-08 ENCOUNTER — Other Ambulatory Visit: Payer: Self-pay | Admitting: Nurse Practitioner

## 2022-01-01 ENCOUNTER — Other Ambulatory Visit: Payer: Self-pay | Admitting: "Endocrinology

## 2022-01-01 ENCOUNTER — Other Ambulatory Visit: Payer: Self-pay | Admitting: Nurse Practitioner

## 2022-01-05 ENCOUNTER — Other Ambulatory Visit: Payer: Self-pay | Admitting: "Endocrinology

## 2022-01-05 ENCOUNTER — Other Ambulatory Visit: Payer: Self-pay | Admitting: Nurse Practitioner
